# Patient Record
Sex: Male | Born: 1980 | Race: White | Hispanic: No | Marital: Married | Smoking: Never smoker
Health system: Southern US, Community
[De-identification: ages and names within clinical notes are randomized; demographics above are authoritative.]

## PROBLEM LIST (undated history)

## (undated) DIAGNOSIS — T7840XA Allergy, unspecified, initial encounter: Secondary | ICD-10-CM

## (undated) HISTORY — PX: SHOULDER ARTHROSCOPY: SHX128

## (undated) HISTORY — DX: Allergy, unspecified, initial encounter: T78.40XA

## (undated) HISTORY — PX: PILONIDAL CYST EXCISION: SHX744

---

## 2007-09-20 ENCOUNTER — Ambulatory Visit: Payer: Self-pay | Admitting: Internal Medicine

## 2013-12-20 ENCOUNTER — Ambulatory Visit: Payer: Self-pay | Admitting: Internal Medicine

## 2015-01-14 ENCOUNTER — Encounter: Payer: Self-pay | Admitting: Family Medicine

## 2015-03-25 ENCOUNTER — Ambulatory Visit (INDEPENDENT_AMBULATORY_CARE_PROVIDER_SITE_OTHER): Payer: BC Managed Care – PPO | Admitting: Family Medicine

## 2015-03-25 ENCOUNTER — Encounter: Payer: Self-pay | Admitting: Family Medicine

## 2015-03-25 VITALS — BP 100/68 | HR 78 | Temp 98.2°F | Ht 72.0 in | Wt 211.0 lb

## 2015-03-25 DIAGNOSIS — T148 Other injury of unspecified body region: Secondary | ICD-10-CM | POA: Diagnosis not present

## 2015-03-25 DIAGNOSIS — T148XXA Other injury of unspecified body region, initial encounter: Secondary | ICD-10-CM

## 2015-03-25 MED ORDER — ETODOLAC 500 MG PO TABS: 500.0000 mg | ORAL_TABLET | Freq: Two times a day (BID) | ORAL | Status: DC

## 2015-03-25 NOTE — Progress Notes (Addendum)
Name: Thomas Kirby   MRN: [id number]    DOB: May 30, 1981   Date:03/25/2015       Progress Note  Subjective  Chief Complaint  Chief Complaint  Patient presents with  . Leg Pain    R) lower leg pain- started approx 5 days ago with tightness, red, and swelling. Got worse yesterday. Pain stops at ankle, not involving the ankle    Leg Pain  The incident occurred 3 to 5 days ago (pain 3-5 days/ swelliing 1 day). Injury mechanism: "run once" The pain is present in the right leg. The pain is at a severity of 4/10. The pain is moderate. The pain has been constant since onset. Pertinent negatives include no inability to bear weight, loss of motion, loss of sensation, muscle weakness, numbness or tingling. The symptoms are aggravated by movement and palpation. He has tried ice and NSAIDs for the symptoms. The treatment provided mild relief.    No problem-specific assessment & plan notes found for this encounter.   Past Medical History  Diagnosis Date  . Allergy     Past Surgical History  Procedure Laterality Date  . Pilonidal cyst excision    . Shoulder arthroscopy Left     Family History  Problem Relation Age of Onset  . Cancer Maternal Grandfather     Social History   Social History  . Marital Status: Married    Spouse Name: N/A  . Number of Children: N/A  . Years of Education: N/A   Occupational History  . Not on file.   Social History Main Topics  . Smoking status: Never Smoker   . Smokeless tobacco: Former Neurosurgeon    Types: Snuff  . Alcohol Use: No  . Drug Use: No  . Sexual Activity: Yes   Other Topics Concern  . Not on file   Social History Narrative  . No narrative on file    Allergies  Allergen Reactions  . Mucinex [Guaifenesin Er]      Review of Systems  Constitutional: Negative for fever, chills, weight loss and malaise/fatigue.  HENT: Negative for ear discharge, ear pain and sore throat.   Eyes: Negative for blurred vision.  Respiratory: Negative  for cough, sputum production, shortness of breath and wheezing.   Cardiovascular: Negative for chest pain, palpitations and leg swelling.  Gastrointestinal: Negative for heartburn, nausea, abdominal pain, diarrhea, constipation, blood in stool and melena.  Genitourinary: Negative for dysuria, urgency, frequency and hematuria.  Musculoskeletal: Negative for myalgias, back pain, joint pain and neck pain.  Skin: Negative for rash.  Neurological: Negative for dizziness, tingling, sensory change, focal weakness, numbness and headaches.  Endo/Heme/Allergies: Negative for environmental allergies and polydipsia. Does not bruise/bleed easily.  Psychiatric/Behavioral: Negative for depression and suicidal ideas. The patient is not nervous/anxious and does not have insomnia.      Objective  Filed Vitals:   03/25/15 1012  BP: 100/68  Pulse: 78  Temp: 98.2 F (36.8 C)  TempSrc: Oral  Height: 6' (1.829 m)  Weight: 211 lb (95.709 kg)    Physical Exam  Constitutional: He is oriented to person, place, and time and well-developed, well-nourished, and in no distress.  HENT:  Head: Normocephalic.  Right Ear: External ear normal.  Left Ear: External ear normal.  Nose: Nose normal.  Mouth/Throat: Oropharynx is clear and moist.  Eyes: Conjunctivae and EOM are normal. Pupils are equal, round, and reactive to light. Right eye exhibits no discharge. Left eye exhibits no discharge. No scleral icterus.  Neck: Normal range of motion. Neck supple. No JVD present. No tracheal deviation present. No thyromegaly present.  Cardiovascular: Normal rate, regular rhythm, normal heart sounds and intact distal pulses.  Exam reveals no gallop and no friction rub.   No murmur heard. Pulmonary/Chest: Breath sounds normal. No respiratory distress. He has no wheezes. He has no rales.  Abdominal: Soft. Bowel sounds are normal. He exhibits no mass. There is no hepatosplenomegaly. There is no tenderness. There is no rebound, no  guarding and no CVA tenderness.  Musculoskeletal: He exhibits tenderness. He exhibits no edema.       Right lower leg: He exhibits tenderness.       Legs: Lymphadenopathy:    He has no cervical adenopathy.  Neurological: He is alert and oriented to person, place, and time. He has normal sensation, normal strength, normal reflexes and intact cranial nerves. No cranial nerve deficit.  Skin: Skin is warm. No rash noted.  Psychiatric: Mood and affect normal.      Assessment & Plan  Problem List Items Addressed This Visit    None    Visit Diagnoses    Contusion    -  Primary    lower leg    Relevant Medications    etodolac (LODINE) 500 MG tablet         Dr. Hayden [facility]  03/25/2015

## 2015-03-27 ENCOUNTER — Ambulatory Visit (INDEPENDENT_AMBULATORY_CARE_PROVIDER_SITE_OTHER): Payer: BC Managed Care – PPO | Admitting: Family Medicine

## 2015-03-27 ENCOUNTER — Encounter: Payer: Self-pay | Admitting: Family Medicine

## 2015-03-27 VITALS — BP 120/62 | HR 60 | Ht 72.0 in | Wt 211.0 lb

## 2015-03-27 DIAGNOSIS — L03115 Cellulitis of right lower limb: Secondary | ICD-10-CM | POA: Diagnosis not present

## 2015-03-27 DIAGNOSIS — M7989 Other specified soft tissue disorders: Secondary | ICD-10-CM

## 2015-03-27 MED ORDER — SULFAMETHOXAZOLE-TRIMETHOPRIM 800-160 MG PO TABS: 1.0000 | ORAL_TABLET | Freq: Two times a day (BID) | ORAL | Status: DC

## 2015-03-27 NOTE — Progress Notes (Signed)
Name: Thomas Kirby   MRN: [id number]    DOB: [birth year]   Date:03/27/2015       Progress Note  Subjective  Chief Complaint  Chief Complaint  Patient presents with  . Leg Swelling    started Etodolac x 2 days ago- swelling has not improved, now leg is warm to touch and tender    Leg Pain  The incident occurred 5 to 7 days ago. The injury mechanism is unknown (began running program). The pain is present in the left leg. The quality of the pain is described as aching. The pain is at a severity of 3/10. The pain is moderate. The pain has been constant since onset. Pertinent negatives include no inability to bear weight, loss of motion, loss of sensation, muscle weakness, numbness or tingling. The symptoms are aggravated by weight bearing. He has tried NSAIDs for the symptoms. The treatment provided no relief.    No problem-specific assessment & plan notes found for this encounter.   Past Medical History  Diagnosis Date  . Allergy     Past Surgical History  Procedure Laterality Date  . Pilonidal cyst excision    . Shoulder arthroscopy Left     Family History  Problem Relation Age of Onset  . Cancer Maternal Grandfather     Social History   Social History  . Marital Status: Married    Spouse Name: N/A  . Number of Children: N/A  . Years of Education: N/A   Occupational History  . Not on file.   Social History Main Topics  . Smoking status: Never Smoker   . Smokeless tobacco: Former Neurosurgeon    Types: Snuff  . Alcohol Use: No  . Drug Use: Not on file  . Sexual Activity: Yes   Other Topics Concern  . Not on file   Social History Narrative    Allergies  Allergen Reactions  . Mucinex [Guaifenesin Er]      Review of Systems  Constitutional: Negative for fever, chills, weight loss and malaise/fatigue.  HENT: Negative for ear discharge, ear pain and sore throat.   Eyes: Negative for blurred vision.  Respiratory: Negative for cough, sputum production, shortness of  breath and wheezing.   Cardiovascular: Negative for chest pain, palpitations and leg swelling.  Gastrointestinal: Negative for heartburn, nausea, abdominal pain, diarrhea, constipation, blood in stool and melena.  Genitourinary: Negative for dysuria, urgency, frequency and hematuria.  Musculoskeletal: Negative for myalgias, back pain, joint pain and neck pain.  Skin: Negative for rash.  Neurological: Negative for dizziness, tingling, sensory change, focal weakness, numbness and headaches.  Endo/Heme/Allergies: Negative for environmental allergies and polydipsia. Does not bruise/bleed easily.  Psychiatric/Behavioral: Negative for depression and suicidal ideas. The patient is not nervous/anxious and does not have insomnia.      Objective  Filed Vitals:   03/27/15 1039  BP: 120/62  Pulse: 60  Height: 6' (1.829 m)  Weight: 211 lb (95.709 kg)    Physical Exam  Constitutional: He is oriented to person, place, and time and well-developed, well-nourished, and in no distress.  HENT:  Head: Normocephalic.  Right Ear: External ear normal.  Left Ear: External ear normal.  Nose: Nose normal.  Mouth/Throat: Oropharynx is clear and moist.  Eyes: Conjunctivae and EOM are normal. Pupils are equal, round, and reactive to light. Right eye exhibits no discharge. Left eye exhibits no discharge. No scleral icterus.  Neck: Normal range of motion. Neck supple. No JVD present. No tracheal deviation present. No thyromegaly  present.  Cardiovascular: Normal rate, regular rhythm, normal heart sounds and intact distal pulses.  Exam reveals no gallop and no friction rub.   No murmur heard. Pulmonary/Chest: Breath sounds normal. No respiratory distress. He has no wheezes. He has no rales.  Abdominal: Soft. Bowel sounds are normal. He exhibits no mass. There is no hepatosplenomegaly. There is no tenderness. There is no rebound, no guarding and no CVA tenderness.  Musculoskeletal: Normal range of motion. He  exhibits edema. He exhibits no tenderness.  Swelling right leg  Lymphadenopathy:    He has no cervical adenopathy.  Neurological: He is alert and oriented to person, place, and time. He has normal sensation, normal strength, normal reflexes and intact cranial nerves. No cranial nerve deficit.  Skin: Skin is warm. No rash noted. There is erythema.  Psychiatric: Mood and affect normal.      Assessment & Plan  Problem List Items Addressed This Visit    None    Visit Diagnoses    Cellulitis of right lower extremity    -  Primary    Relevant Medications    sulfamethoxazole-trimethoprim (BACTRIM DS,SEPTRA DS) 800-160 MG tablet    Swelling of right lower extremity        Relevant Orders    US Venous Img Lower Unilateral Right         Dr. Elizabeth Sauer [facility]  03/27/2015

## 2015-03-30 ENCOUNTER — Ambulatory Visit
Admission: RE | Admit: 2015-03-30 | Discharge: 2015-03-30 | Disposition: A | Discharge status: Home / Self Care | Payer: BC Managed Care – PPO | Source: Ambulatory Visit | Attending: Family Medicine | Admitting: Family Medicine

## 2015-03-30 DIAGNOSIS — M7989 Other specified soft tissue disorders: Secondary | ICD-10-CM | POA: Insufficient documentation

## 2015-12-04 ENCOUNTER — Encounter: Payer: Self-pay | Admitting: Family Medicine

## 2015-12-04 ENCOUNTER — Ambulatory Visit (INDEPENDENT_AMBULATORY_CARE_PROVIDER_SITE_OTHER): Payer: BC Managed Care – PPO | Admitting: Family Medicine

## 2015-12-04 VITALS — BP 100/64 | HR 62 | Ht 72.0 in | Wt 218.0 lb

## 2015-12-04 DIAGNOSIS — Z131 Encounter for screening for diabetes mellitus: Secondary | ICD-10-CM | POA: Diagnosis not present

## 2015-12-04 DIAGNOSIS — Z0000 Encounter for general adult medical examination without abnormal findings: Secondary | ICD-10-CM

## 2015-12-04 DIAGNOSIS — Z309 Encounter for contraceptive management, unspecified: Secondary | ICD-10-CM | POA: Diagnosis not present

## 2015-12-04 DIAGNOSIS — Z13220 Encounter for screening for lipoid disorders: Secondary | ICD-10-CM

## 2015-12-04 DIAGNOSIS — Z3009 Encounter for other general counseling and advice on contraception: Secondary | ICD-10-CM

## 2015-12-04 LAB — POCT URINALYSIS DIPSTICK
Bilirubin, UA: NEGATIVE
Blood, UA: NEGATIVE
Glucose, UA: NEGATIVE
Ketones, UA: NEGATIVE
Leukocytes, UA: NEGATIVE
Nitrite, UA: NEGATIVE
Protein, UA: NEGATIVE
Spec Grav, UA: 1.01
Urobilinogen, UA: 0.2
pH, UA: 5

## 2015-12-04 NOTE — Progress Notes (Signed)
Name: Thomas Kirby   MRN: [id number]    DOB: Dec 07, 1980   Date:12/04/2015       Progress Note  Subjective  Chief Complaint  Chief Complaint  Patient presents with  . Annual Exam    HPI Comments: Patient presents for annual physical exam.   No problem-specific assessment & plan notes found for this encounter.   Past Medical History  Diagnosis Date  . Allergy     Past Surgical History  Procedure Laterality Date  . Pilonidal cyst excision    . Shoulder arthroscopy Left     Family History  Problem Relation Age of Onset  . Cancer Maternal Grandfather     Social History   Social History  . Marital Status: Married    Spouse Name: N/A  . Number of Children: N/A  . Years of Education: N/A   Occupational History  . Not on file.   Social History Main Topics  . Smoking status: Never Smoker   . Smokeless tobacco: Former Neurosurgeon    Types: Snuff  . Alcohol Use: No  . Drug Use: Not on file  . Sexual Activity: Yes   Other Topics Concern  . Not on file   Social History Narrative    Allergies  Allergen Reactions  . Mucinex [Guaifenesin Er]      Review of Systems  Constitutional: Negative for fever, chills, weight loss and malaise/fatigue.  HENT: Negative for ear discharge, ear pain and sore throat.   Eyes: Negative for blurred vision.  Respiratory: Negative for cough, sputum production, shortness of breath and wheezing.   Cardiovascular: Negative for chest pain, palpitations and leg swelling.  Gastrointestinal: Negative for heartburn, nausea, abdominal pain, diarrhea, constipation, blood in stool and melena.  Genitourinary: Negative for dysuria, urgency, frequency and hematuria.  Musculoskeletal: Negative for myalgias, back pain, joint pain and neck pain.  Skin: Negative for rash.  Neurological: Negative for dizziness, tingling, sensory change, focal weakness and headaches.  Endo/Heme/Allergies: Negative for environmental allergies and polydipsia. Does not  bruise/bleed easily.  Psychiatric/Behavioral: Negative for depression and suicidal ideas. The patient is not nervous/anxious and does not have insomnia.      Objective  Filed Vitals:   12/04/15 0840  BP: 100/64  Pulse: 62  Height: 6' (1.829 m)  Weight: 218 lb (98.884 kg)    Physical Exam  Constitutional: He is oriented to person, place, and time and well-developed, well-nourished, and in no distress.  HENT:  Head: Normocephalic.  Right Ear: Tympanic membrane, external ear and ear canal normal.  Left Ear: Tympanic membrane, external ear and ear canal normal.  Nose: Nose normal.  Mouth/Throat: Uvula is midline, oropharynx is clear and moist and mucous membranes are normal.  Eyes: Conjunctivae and EOM are normal. Pupils are equal, round, and reactive to light. Right eye exhibits no discharge. Left eye exhibits no discharge. No scleral icterus.  Neck: Normal range of motion. Neck supple. No JVD present. No tracheal deviation present. No thyromegaly present.  Cardiovascular: Normal rate, regular rhythm, normal heart sounds and intact distal pulses.  Exam reveals no gallop and no friction rub.   No murmur heard. Pulmonary/Chest: Breath sounds normal. No respiratory distress. He has no wheezes. He has no rales.  Abdominal: Soft. Bowel sounds are normal. He exhibits no mass. There is no hepatosplenomegaly. There is no tenderness. There is no rebound, no guarding and no CVA tenderness.  Genitourinary: Testes/scrotum normal and penis normal. He exhibits no abnormal testicular mass, no testicular tenderness and no  scrotal tenderness.  Musculoskeletal: Normal range of motion. He exhibits no edema or tenderness.  Lymphadenopathy:    He has no cervical adenopathy.  Neurological: He is alert and oriented to person, place, and time. He has normal sensation, normal strength, normal reflexes and intact cranial nerves. No cranial nerve deficit.  Skin: Skin is warm. No rash noted.  Psychiatric: Mood  and affect normal.  Nursing note and vitals reviewed.     Assessment & Plan  Problem List Items Addressed This Visit      Other   Diabetes mellitus screening   Relevant Orders   Renal Function Panel    Other Visit Diagnoses    Annual physical exam    -  Primary    Relevant Orders    POCT Urinalysis Dipstick (Completed)    Lipid Profile    Renal Function Panel    Screening cholesterol level        Relevant Orders    Lipid Profile    Renal Function Panel    Vasectomy evaluation        Relevant Orders    Ambulatory referral to Urology         Dr. Elizabeth Sauereanna Zyquan Crotty [facility]  12/04/2015

## 2015-12-05 LAB — RENAL FUNCTION PANEL
Albumin: 4.5 g/dL (ref 3.5–5.5)
BUN/Creatinine Ratio: 21 — ABNORMAL HIGH (ref 9–20)
BUN: 23 mg/dL — ABNORMAL HIGH (ref 6–20)
CO2: 24 mmol/L (ref 18–29)
Calcium: 9.7 mg/dL (ref 8.7–10.2)
Chloride: 100 mmol/L (ref 96–106)
Creatinine, Ser: 1.11 mg/dL (ref 0.76–1.27)
GFR calc Af Amer: 98 mL/min/{1.73_m2} (ref >59)
GFR calc non Af Amer: 85 mL/min/{1.73_m2} (ref >59)
Glucose: 83 mg/dL (ref 65–99)
Phosphorus: 3.1 mg/dL (ref 2.5–4.5)
Potassium: 4.5 mmol/L (ref 3.5–5.2)
Sodium: 139 mmol/L (ref 134–144)

## 2015-12-05 LAB — LIPID PANEL
Chol/HDL Ratio: 3.3 ratio units (ref 0.0–5.0)
Cholesterol, Total: 184 mg/dL (ref 100–199)
HDL: 55 mg/dL (ref >39)
LDL Calculated: 115 mg/dL — ABNORMAL HIGH (ref 0–99)
Triglycerides: 71 mg/dL (ref 0–149)
VLDL Cholesterol Cal: 14 mg/dL (ref 5–40)

## 2015-12-21 ENCOUNTER — Ambulatory Visit (INDEPENDENT_AMBULATORY_CARE_PROVIDER_SITE_OTHER): Payer: BC Managed Care – PPO | Admitting: Urology

## 2015-12-21 ENCOUNTER — Encounter: Payer: Self-pay | Admitting: Urology

## 2015-12-21 VITALS — BP 102/71 | HR 132 | Ht 71.0 in | Wt 218.5 lb

## 2015-12-21 DIAGNOSIS — Z309 Encounter for contraceptive management, unspecified: Secondary | ICD-10-CM

## 2015-12-21 DIAGNOSIS — Z3009 Encounter for other general counseling and advice on contraception: Secondary | ICD-10-CM

## 2015-12-21 MED ORDER — DIAZEPAM 10 MG PO TABS: ORAL_TABLET | ORAL | Status: DC

## 2015-12-21 NOTE — Progress Notes (Signed)
12/21/2015 3:42 PM   Thomas Kirby [birth year] [id number]  Referring provider: Duanne Limerick, MD [address] [place], [place] [address]  Chief Complaint  Patient presents with  . New Patient (Initial Visit)  . VAS Consult    HPI: [name] is a 35 year old Caucasian male who is referred today for a vasectomy by Dr. Yetta Barre.  Patient has two children, sons, and wishes to end his family unit at this point.  Patient denies any history of chronic prostatitis, epididymitis, orchitis, or other genital pain.  Today, we discussed what the vas deferens is, where it is located, and its function. We reviewed the procedure for vasectomy, it's risks, benefits, alternatives, and likelihood of achieving his goals.   We discussed in detail the procedure, complications, and recovery as well as the need for clearance prior to unprotected intercourse. We discussed that vasectomy does not protect against sexually transmitted diseases. We discussed that this procedure does not result in immediate sterility and that they would need to use other forms of birth control until he has been cleared with a three month negative postvasectomy semen analyses.  I explained that the procedure is considered to be permanent and that attempts at reversal have varying degrees of success. These options include vasectomy reversal, sperm retrieval, and in vitro fertilization; these can be very expensive.   We discussed the chance of postvasectomy pain syndrome which occurs in less than 5% of patients. I explained to the patient that there is no treatment to resolve this chronic pain, and that if it developed I would not be able to help resolve the issue, but that surgery is generally not needed for correction.   I explained there have even been reports of systemic like illness associated with this chronic pain, and that there was no good cure. I explained that vasectomy it is not a 100% reliable form of  birth control, and the risk of pregnancy after vasectomy is approximately 1 in 2000 men who had a negative postvasectomy semen analysis or rare non-motile sperm.  I explained that repeat vasectomy was necessary in less than 1% of vasectomy procedures when employing the type of technique that is performed in the office. I explained that he should refrain from ejaculation for approximately one week following vasectomy. I explained that there are other options for birth control which are permanent and non-permanent; we discussed these.  I explained the rates of surgical complications, such as symptomatic hematoma or infection, are low (1-2%) and vary with the surgeon's experience and criteria used to diagnose the complication.   PMH: Past Medical History  Diagnosis Date  . Allergy     Surgical History: Past Surgical History  Procedure Laterality Date  . Pilonidal cyst excision    . Shoulder arthroscopy Left     Home Medications:    Medication List       This list is accurate as of: 12/21/15  3:42 PM.  Always use your most recent med list.               cetirizine 10 MG tablet  Commonly known as:  ZYRTEC  Take 10 mg by mouth daily. Reported on 12/04/2015        Allergies:  Allergies  Allergen Reactions  . Mucinex [Guaifenesin Er]     Family History: Family History  Problem Relation Age of Onset  . Cancer Maternal Grandfather   . Kidney cancer Neg Hx   .  Kidney disease Neg Hx   . Prostate cancer Neg Hx     Social History:  reports that he has never smoked. He has quit using smokeless tobacco. His smokeless tobacco use included Snuff. He reports that he does not drink alcohol or use illicit drugs.  ROS: UROLOGY Frequent Urination?: No Hard to postpone urination?: No Burning/pain with urination?: No Get up at night to urinate?: No Leakage of urine?: No Urine stream starts and stops?: No Trouble starting stream?: No Do you have to strain to urinate?: No Blood in  urine?: No Urinary tract infection?: No Sexually transmitted disease?: No Injury to kidneys or bladder?: No Painful intercourse?: No Weak stream?: No Erection problems?: No Penile pain?: No  Gastrointestinal Nausea?: No Vomiting?: No Indigestion/heartburn?: No Diarrhea?: No Constipation?: No  Constitutional Fever: No Night sweats?: No Weight loss?: No Fatigue?: No  Skin Skin rash/lesions?: No Itching?: No  Eyes Blurred vision?: No Double vision?: No  Ears/Nose/Throat Sore throat?: No Sinus problems?: No  Hematologic/Lymphatic Swollen glands?: No Easy bruising?: No  Cardiovascular Leg swelling?: No Chest pain?: No  Respiratory Cough?: No Shortness of breath?: No  Endocrine Excessive thirst?: No  Musculoskeletal Back pain?: No Joint pain?: No  Neurological Headaches?: No Dizziness?: No  Psychologic Depression?: No Anxiety?: No  Physical Exam: BP 102/71 mmHg  Pulse 132  Ht 5\' 11"  (1.803 m)  Wt 218 lb 8 oz (99.111 kg)  BMI 30.49 kg/m2  Constitutional: Well nourished. Alert and oriented, No acute distress. HEENT: [place] AT, moist mucus membranes. Trachea midline, no masses. Cardiovascular: No clubbing, cyanosis, or edema. Respiratory: Normal respiratory effort, no increased work of breathing. GI: Abdomen is soft, non tender, non distended, no abdominal masses. Liver and spleen not palpable.  No hernias appreciated.  Stool sample for occult testing is not indicated.   GU: No CVA tenderness.  No bladder fullness or masses.  Patient with uncircumcised phallus. Foreskin easily retracted Urethral meatus is patent.  No penile discharge. No penile lesions or rashes. Scrotum without lesions, cysts, rashes and/or edema.  Testicles are located scrotally bilaterally. No masses are appreciated in the testicles. Left and right epididymis are normal. Rectal: Deferred.   Skin: No rashes, bruises or suspicious lesions. Lymph: No cervical or inguinal  adenopathy. Neurologic: Grossly intact, no focal deficits, moving all 4 extremities. Psychiatric: Normal mood and affect.  Laboratory Data:  Lab Results  Component Value Date   CREATININE 1.11 12/04/2015        Component Value Date/Time   CHOL 184 12/04/2015 0944   HDL 55 12/04/2015 0944   CHOLHDL 3.3 12/04/2015 0944   LDLCALC 115* 12/04/2015 0944      Assessment & Plan:     1. Vasectomy consult:  Patient has read and signed the consent.  He is given the pre-op vasectomy instruction sheet.  He is prescribed Valium 10 mg and instructed to take it 30 minutes prior to his vasectomy appointment.  He is to have a driver.  I reemphasized to the patient that this is to be considered a permanent form of birth control, that he is to use an alternative form of birth control until we receive the 3 months specimen and it is cleared of sperm and that this will not prevent STI's.  His questions are answered to his satisfaction and he understands the risks and is willing to proceed with the vasectomy.  He will schedule his vasectomy.    I spent 30 minutes in a face-to-face conversation concerning the vasectomy procedure  and pre-and post op expectations.  Greater than 50% was spent in counseling & coordination of care with the patient.   Return for schedule vasectomy.  These notes generated with voice recognition software. I apologize for typographical errors.  Michiel CowboySHANNON Vernal Rutan, PA-C  [facility] [address], [address] [place], [place] [address] [phone]

## 2016-01-27 ENCOUNTER — Ambulatory Visit (INDEPENDENT_AMBULATORY_CARE_PROVIDER_SITE_OTHER): Payer: BC Managed Care – PPO | Admitting: Urology

## 2016-01-27 ENCOUNTER — Encounter: Payer: Self-pay | Admitting: Urology

## 2016-01-27 VITALS — BP 119/76 | HR 64 | Ht 71.0 in | Wt 214.1 lb

## 2016-01-27 DIAGNOSIS — Z302 Encounter for sterilization: Secondary | ICD-10-CM | POA: Diagnosis not present

## 2016-01-27 NOTE — Progress Notes (Signed)
01/27/2016 5:38 PM   Thedora Hinders [birth year] [id number]  Referring provider: Duanne Limerick, MD [address] [place], [place] [address]  Chief Complaint  Patient presents with  . Sterilization    HPI: 35 year old male with 2 biological children who desires no further biological pregnancies. He presents today for vasectomy. His questions were all previously answered.  He has no additional questions today.   PMH: Past Medical History:  Diagnosis Date  . Allergy     Surgical History: Past Surgical History:  Procedure Laterality Date  . PILONIDAL CYST EXCISION    . SHOULDER ARTHROSCOPY Left     Home Medications:    Medication List       Accurate as of 01/27/16  5:38 PM. Always use your most recent med list.          cetirizine 10 MG tablet Commonly known as:  ZYRTEC Take 10 mg by mouth daily. Reported on 12/04/2015   diazepam 10 MG tablet Commonly known as:  VALIUM Take 30 minutes prior to vasectomy       Allergies:  Allergies  Allergen Reactions  . Mucinex [Guaifenesin Er]     Family History: Family History  Problem Relation Age of Onset  . Cancer Maternal Grandfather   . Kidney cancer Neg Hx   . Kidney disease Neg Hx   . Prostate cancer Neg Hx     Social History:  reports that he has never smoked. He has quit using smokeless tobacco. His smokeless tobacco use included Snuff. He reports that he does not drink alcohol or use drugs.     Physical Exam: BP 119/76   Pulse 64   Ht  (1.803 m)   Wt 214 lb 1.6 oz (97.1 kg)   BMI 29.86 kg/m   Constitutional:  Alert and oriented, No acute distress. HEENT: [place] AT, moist mucus membranes.  Trachea midline, no masses. Cardiovascular: No clubbing, cyanosis, or edema. Respiratory: Normal respiratory effort, no increased work of breathing. GI: Abdomen is soft, nontender, nondistended, no abdominal masses s. Circumcised phallus. Normal descended testicles bilaterally. Vasa easily  palpable. Lymph: No cervical or inguinal adenopathy. Neurologic: Grossly intact, no focal deficits, moving all 4 extremities. Psychiatric: Normal mood and affect.  Laboratory Data  Lab Results  Component Value Date   CREATININE 1.11 12/04/2015    Bilateral Vasectomy Procedure  Pre-Procedure: - Patient's scrotum was prepped and draped for vasectomy. - The vas was palpated through the scrotal skin on the left. - 1% Xylocaine was injected into the skin and surrounding tissue for placement  - In a similar manner, the vas on the right was identified, anesthetized, and stabilized.  Procedure: - A bladeless technique was used to open the overlying skin (sharp dissection) - The left vas was isolated and brought up through the incision exposing that structure. - Bleeding points were cauterized as they occurred. - The vas was free from the surrounding structures and brought to the view. - A segment was positioned for placement with a hemostat. - A second hemostat was placed and a small segment between the two hemostats and was removed for inspection. - Each end of the transected vas lumen was fulgurated/ obliterated using needlepoint electrocautery -A fascial interposition was performed on testicular end of the vas using #3-0 chromic suture -The same procedure was performed on the right. - A single suture of #3-0 chromic catgut was used to close each lateral scrotal skin incision - A dressing was applied.  Post-Procedure: -  Patient was instructed in care of the operative area - A specimen is to be delivered in 12 weeks   -Another form of contraception is to be used until    Assessment & Plan:    1. Encounter for vasectomy Status post uncomplicated vasectomy Postop instructions as above  Vanna ScotlandAshley Needham Biggins, MD  [facility] [address], [address] [place], [place] [address] [phone]

## 2016-06-16 ENCOUNTER — Other Ambulatory Visit: Payer: BC Managed Care – PPO

## 2016-06-16 DIAGNOSIS — Z9852 Vasectomy status: Secondary | ICD-10-CM

## 2016-06-17 LAB — POST-VAS SPERM EVALUATION,QUAL: Volume: 1.8 mL

## 2016-06-21 ENCOUNTER — Telehealth: Payer: Self-pay

## 2016-06-21 NOTE — Telephone Encounter (Signed)
LMOM- recent analysis is clear, good to go.

## 2016-06-21 NOTE — Telephone Encounter (Signed)
-----   Message from Vanna ScotlandAshley Brandon, MD sent at 06/19/2016 11:59 AM EST ----- No sperm, good to go.    Vanna ScotlandAshley Brandon, MD

## 2016-07-08 ENCOUNTER — Encounter: Payer: Self-pay | Admitting: Family Medicine

## 2016-07-08 ENCOUNTER — Ambulatory Visit (INDEPENDENT_AMBULATORY_CARE_PROVIDER_SITE_OTHER): Payer: BC Managed Care – PPO | Admitting: Family Medicine

## 2016-07-08 VITALS — BP 100/62 | HR 64 | Ht 71.0 in | Wt 206.0 lb

## 2016-07-08 DIAGNOSIS — J40 Bronchitis, not specified as acute or chronic: Secondary | ICD-10-CM

## 2016-07-08 DIAGNOSIS — J0100 Acute maxillary sinusitis, unspecified: Secondary | ICD-10-CM | POA: Diagnosis not present

## 2016-07-08 MED ORDER — AMOXICILLIN 500 MG PO CAPS: 500.0000 mg | ORAL_CAPSULE | Freq: Three times a day (TID) | ORAL | 0 refills | Status: DC

## 2016-07-08 NOTE — Progress Notes (Signed)
Patient: Thomas Kirby, Male    DOB: 12-07-1980, 36 y.o.   MRN: [id number] Visit Date: 07/08/2016  Today's Provider: Elizabeth Sauer, MD   Chief Complaint  Patient presents with  . Sinusitis    cough and cong- facial pressure and headache, teeth hurting   Subjective:   Initial preventative physical exam Thomas Kirby is a 36 y.o. male who presents today for his Initial Preventative Physical Exam. He feels well. He reports exercising 15 minutes. He reports he is sleeping well.  Sinusitis  This is a new problem. The current episode started in the past 7 days. The problem has been gradually worsening since onset. There has been no fever. Associated symptoms include congestion, coughing, headaches, a hoarse voice, shortness of breath, sinus pressure, sneezing and a sore throat. Pertinent negatives include no chills, diaphoresis, ear pain, neck pain or swollen glands. Past treatments include nothing. The treatment provided no relief.  Cough  This is a new problem. The current episode started in the past 7 days. The problem has been gradually worsening. The cough is productive of purulent sputum. Associated symptoms include a fever, headaches, hemoptysis, nasal congestion, a sore throat and shortness of breath. Pertinent negatives include no chest pain, chills, ear pain, myalgias, postnasal drip, rash or wheezing. He has tried nothing for the symptoms. There is no history of environmental allergies.    Review of Systems  Constitutional: Positive for fever. Negative for chills and diaphoresis.  HENT: Positive for congestion, hoarse voice, sinus pressure, sneezing and sore throat. Negative for drooling, ear discharge, ear pain and postnasal drip.   Respiratory: Positive for cough, hemoptysis and shortness of breath. Negative for wheezing.   Cardiovascular: Negative for chest pain, palpitations and leg swelling.  Gastrointestinal: Negative for abdominal pain, blood in stool, constipation, diarrhea and  nausea.  Endocrine: Negative for polydipsia.  Genitourinary: Negative for dysuria, frequency, hematuria and urgency.  Musculoskeletal: Negative for back pain, myalgias and neck pain.  Skin: Negative for rash.  Allergic/Immunologic: Negative for environmental allergies.  Neurological: Positive for headaches. Negative for dizziness.  Hematological: Does not bruise/bleed easily.  Psychiatric/Behavioral: Negative for suicidal ideas. The patient is not nervous/anxious.     Social History   Social History  . Marital status: Married    Spouse name: N/A  . Number of children: N/A  . Years of education: N/A   Occupational History  . Not on file.   Social History Main Topics  . Smoking status: Never Smoker  . Smokeless tobacco: Former Neurosurgeon    Types: Snuff  . Alcohol use No  . Drug use: No  . Sexual activity: Yes   Other Topics Concern  . Not on file   Social History Narrative  . No narrative on file    Patient Active Problem List   Diagnosis Date Noted  . Diabetes mellitus screening 12/04/2015    Past Surgical History:  Procedure Laterality Date  . PILONIDAL CYST EXCISION    . SHOULDER ARTHROSCOPY Left     His family history includes Cancer in his maternal grandfather.     Previous Medications   CETIRIZINE (ZYRTEC) 10 MG TABLET    Take 10 mg by mouth daily. Reported on 12/04/2015    Patient Care Team: Duanne Limerick, MD as PCP - General (Family Medicine)      Objective:   Vitals: BP 100/62   Pulse 64   Ht 5\' 11"  (1.803 m)   Wt 206 lb (93.4 kg)   BMI 28.73  kg/m   Physical Exam  Constitutional: He is oriented to person, place, and time. He appears well-developed and well-nourished.  HENT:  Head: Normocephalic.  Right Ear: External ear normal.  Left Ear: External ear normal.  Nose: Nose normal.  Mouth/Throat: Oropharynx is clear and moist.  Eyes: Conjunctivae and EOM are normal. Pupils are equal, round, and reactive to light.  Neck: Normal range of  motion. Neck supple.  Cardiovascular: Normal rate, regular rhythm, normal heart sounds and intact distal pulses.   Pulmonary/Chest: Effort normal and breath sounds normal. He has no wheezes. He has no rales.  Abdominal: Soft. Bowel sounds are normal. There is no tenderness. There is no rebound.  Genitourinary: Rectum normal, prostate normal and penis normal.  Musculoskeletal: Normal range of motion.  Neurological: He is alert and oriented to person, place, and time.  Skin: Skin is warm and dry.  Psychiatric: He has a normal mood and affect. His behavior is normal. Judgment and thought content normal.  Nursing note and vitals reviewed.    No exam data present  Activities of Daily Living In your present state of health, do you have any difficulty performing the following activities: 12/04/2015  Hearing? N  Vision? N  Difficulty concentrating or making decisions? N  Walking or climbing stairs? N  Dressing or bathing? N  Doing errands, shopping? N  Some recent data might be hidden    Fall Risk Assessment Fall Risk  12/04/2015  Falls in the past year? No     Patient reports there  safety devices in place in shower at home.   Depression Screen PHQ 2/9 Scores 12/04/2015  PHQ - 2 Score 0    Cognitive Testing - 6-CIT   Correct? Score   What year is it? yes 0 Yes = 0    No = 4  What month is it? yes 0 Yes = 0    No = 3  Remember:     Floyde ParkinsJohn Smith, [address] [place], [place]     What time is it? yes 0 Yes = 0    No = 3  Count backwards from 20 to 1 yes 0 Correct = 0    1 error = 2   More than 1 error = 4  Say the months of the year in reverse. yes 0 Correct = 0    1 error = 2   More than 1 error = 4  What address did I ask you to remember? yes 0 Correct = 0  1 error = 2    2 error = 4    3 error = 6    4 error = 8    All wrong = 10       TOTAL SCORE  0/28   Interpretation:  Normal  Normal (0-7) Abnormal (8-28)     Assessment & Plan:     Initial Preventative Physical  Exam  Reviewed patient's Family Medical History Reviewed and updated list of patient's medical providers Assessment of cognitive impairment was done Assessed patient's functional ability Established a written schedule for health screening services Health Risk Assessent Completed and Reviewed  Exercise Activities and Dietary recommendations Goals    None       There is no immunization history on file for this patient.  Health Maintenance  Topic Date Due  . HIV Screening  07/04/1994  . TETANUS/TDAP  07/04/1998  . INFLUENZA VACCINE  01/19/2016     Discussed health benefits of physical activity,  and encouraged him to engage in regular exercise appropriate for his age and condition.    ------------------------------------------------------------------------------------------------------------  Problem List Items Addressed This Visit    None    Visit Diagnoses    Acute maxillary sinusitis, recurrence not specified    -  Primary   Relevant Medications   amoxicillin (AMOXIL) 500 MG capsule   Bronchitis       Relevant Medications   amoxicillin (AMOXIL) 500 MG capsule       Elizabeth Sauer, MD [facility]  07/08/2016

## 2016-08-07 ENCOUNTER — Ambulatory Visit
Admission: EM | Admit: 2016-08-07 | Discharge: 2016-08-07 | Disposition: A | Discharge status: Home / Self Care | Payer: BC Managed Care – PPO | Attending: Family Medicine | Admitting: Family Medicine

## 2016-08-07 DIAGNOSIS — R69 Illness, unspecified: Secondary | ICD-10-CM

## 2016-08-07 DIAGNOSIS — J111 Influenza due to unidentified influenza virus with other respiratory manifestations: Secondary | ICD-10-CM

## 2016-08-07 MED ORDER — OSELTAMIVIR PHOSPHATE 75 MG PO CAPS: 75.0000 mg | ORAL_CAPSULE | Freq: Two times a day (BID) | ORAL | 0 refills | Status: DC

## 2016-08-07 NOTE — Discharge Instructions (Signed)
Take medication as prescribed. Rest. Drink plenty of fluids.  ° °Follow up with your primary care physician this week as needed. Return to Urgent care for new or worsening concerns.  ° °

## 2016-08-07 NOTE — ED Provider Notes (Signed)
[facility] ____________________________________________  Time seen: Approximately 9:54 AM  I have reviewed the triage vital signs and the nursing notes.   HISTORY  Chief Complaint Facial Pain (as per pt onset yesterday had sinusitis  and by night had fever 101 and taken OTC meds)   HPI Thomas Kirby is a 36 y.o. male presenting for the complaints of onset of cough, congestion and chills, body aches and fever last night. Patient reports yesterday he felt fine. Patient denies sore throat. Reports continues to eat and drink well. Intermittently taking Tylenol intermittently for his fever. Reports fever up to 101 orally. Denies sick contacts. 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   Denies chest pain, shortness of breath, abdominal pain, dysuria, extremity pain, extremity swelling or rash. Denies recent sickness. Denies recent antibiotic use. Denies renal insufficiency.   Past Medical History:  Diagnosis Date  . Allergy     Patient Active Problem List   Diagnosis Date Noted  . Diabetes mellitus screening 12/04/2015    Past Surgical History:  Procedure Laterality Date  . PILONIDAL CYST EXCISION    . SHOULDER ARTHROSCOPY Left      No current facility-administered medications for this encounter.   Current Outpatient Prescriptions:  .  amoxicillin (AMOXIL) 500 MG capsule, Take 1 capsule (500 mg total) by mouth 3 (three) times daily., Disp: 30 capsule, Rfl: 0 .  cetirizine (ZYRTEC) 10 MG tablet, Take 10 mg by mouth daily. Reported on 12/04/2015, Disp: , Rfl:  .  oseltamivir (TAMIFLU) 75 MG capsule, Take 1 capsule (75 mg total) by mouth every 12 (twelve) hours., Disp: 10 capsule, Rfl: 0  Allergies Mucinex [guaifenesin er]  Family History  Problem Relation Age of Onset  . Cancer Maternal Grandfather   . Kidney cancer Neg Hx   . Kidney disease Neg Hx   . Prostate cancer Neg Hx     Social History Social  History  Substance Use Topics  . Smoking status: Never Smoker  . Smokeless tobacco: Former Neurosurgeon    Types: Snuff  . Alcohol use No    Review of Systems Constitutional: As above.  Eyes: No visual changes. ENT: No sore throat. Cardiovascular: Denies chest pain. Respiratory: Denies shortness of breath. Gastrointestinal: No abdominal pain.  No nausea, no vomiting.  No diarrhea.  No constipation. Genitourinary: Negative for dysuria. Musculoskeletal: Negative for back pain. Skin: Negative for rash. Neurological: Negative for headaches, focal weakness or numbness.  10-point ROS otherwise negative.  ____________________________________________   PHYSICAL EXAM:  VITAL SIGNS: ED Triage Vitals [08/07/16 0821]  Enc Vitals Group     BP 116/64     Pulse Rate 77     Resp 16     Temp 98.1 F (36.7 C)     Temp Source Oral     SpO2 96 %     Weight      Height      Head Circumference      Peak Flow      Pain Score 0     Pain Loc      Pain Edu?      Excl. in GC?     Constitutional: Alert and oriented. Well appearing and in no acute distress. Eyes: Conjunctivae are normal. PERRL. EOMI. Head: Atraumatic. No sinus tenderness to palpation. No swelling. No erythema.  Ears: no erythema, normal TMs bilaterally.   Nose:Nasal congestion with clear rhinorrhea  Mouth/Throat: Mucous membranes are moist.  No pharyngeal erythema. No tonsillar swelling or exudate.  Neck: No stridor.  No cervical spine tenderness to palpation. Hematological/Lymphatic/Immunilogical: No cervical lymphadenopathy. Cardiovascular: Normal rate, regular rhythm. Grossly normal heart sounds.  Good peripheral circulation. Respiratory: Normal respiratory effort.  No retractions. No wheezes, rales or rhonchi. Good air movement.  Gastrointestinal: Soft and nontender. Normal Bowel sounds. No CVA tenderness. Musculoskeletal: Ambulatory with steady gait. No cervical, thoracic or lumbar tenderness to palpation. Neurologic:   Normal speech and language. No gait instability. Skin:  Skin appears warm, dry and intact. No rash noted. Psychiatric: Mood and affect are normal. Speech and behavior are normal.  ___________________________________________   LABS (all labs ordered are listed, but only abnormal results are displayed)  Labs Reviewed - No data to display ____________________________________________   PROCEDURES Procedures    INITIAL IMPRESSION / ASSESSMENT AND PLAN / ED COURSE  Pertinent labs & imaging results that were available during my care of the patient were reviewed by me and considered in my medical decision making (see chart for details).  Mother and patient. No acute distress. Suspect influenza. Discussed evaluation treatment options with patient. Will culture patient with oral Tamiflu. Encouraged supportive care, rest, fluids and PCP follow-up as needed.Discussed indication, risks and benefits of medications with patient.  Discussed follow up with Primary care physician this week. Discussed follow up and return parameters including no resolution or any worsening concerns. Patient verbalized understanding and agreed to plan.   ____________________________________________   FINAL CLINICAL IMPRESSION(S) / ED DIAGNOSES  Final diagnoses:  Influenza-like illness     Discharge Medication List as of 08/07/2016  8:48 AM    START taking these medications   Details  oseltamivir (TAMIFLU) 75 MG capsule Take 1 capsule (75 mg total) by mouth every 12 (twelve) hours., Starting Sun 08/07/2016, Normal        Note: This dictation was prepared with Dragon dictation along with smaller phrase technology. Any transcriptional errors that result from this process are unintentional.        Renford DillsLindsey Jlee Harkless, NP 08/07/16 [phone]

## 2017-04-13 IMAGING — US US EXTREM LOW VENOUS*R*
1 series · 13 of 24 positions shown · non-contrast
Comparison: None.

CLINICAL DATA: Right lower extremity pain and swelling for 1 week.
Evaluate for DVT.



[Series 1: us extrem low venous*right* · 0.08mm/px · 13 of 39 slices shown]
[im 1/39]
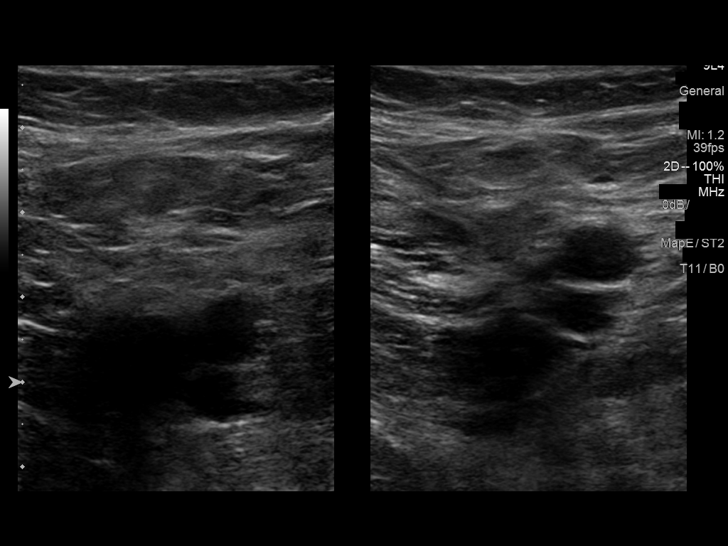
[im 4/39]
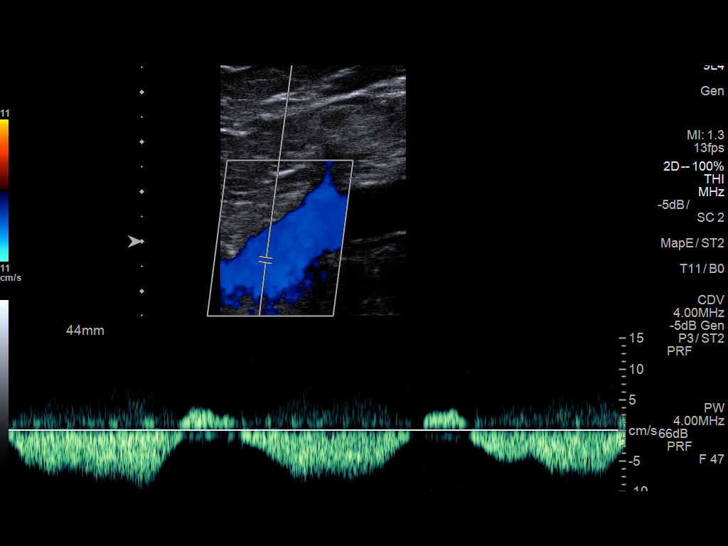
[im 7/39]
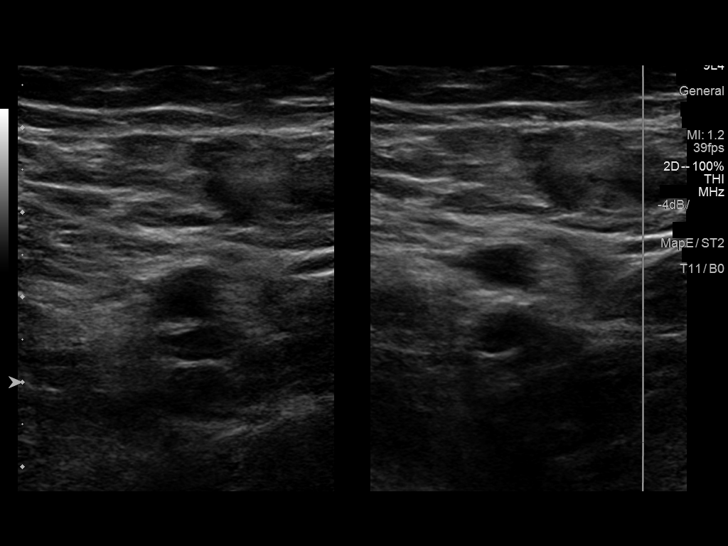
[im 10/39]
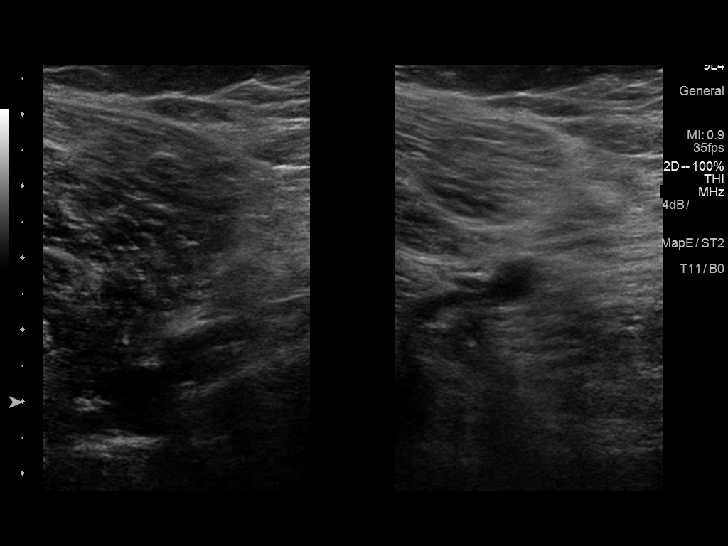
[im 14/39]
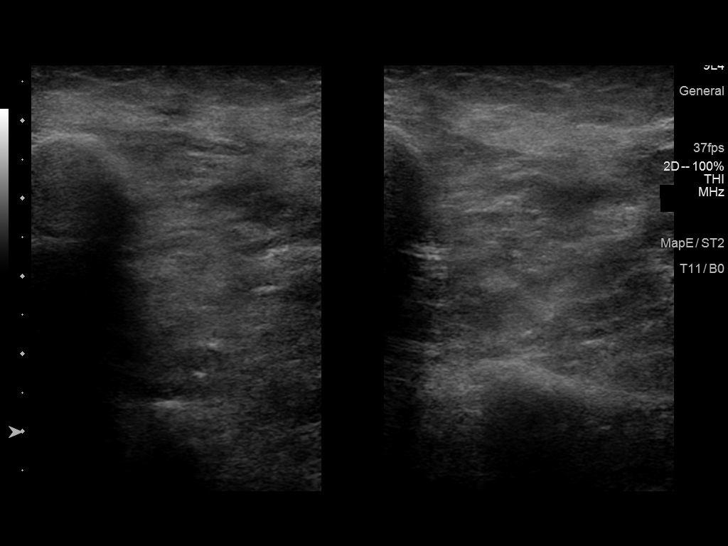
[im 17/39]
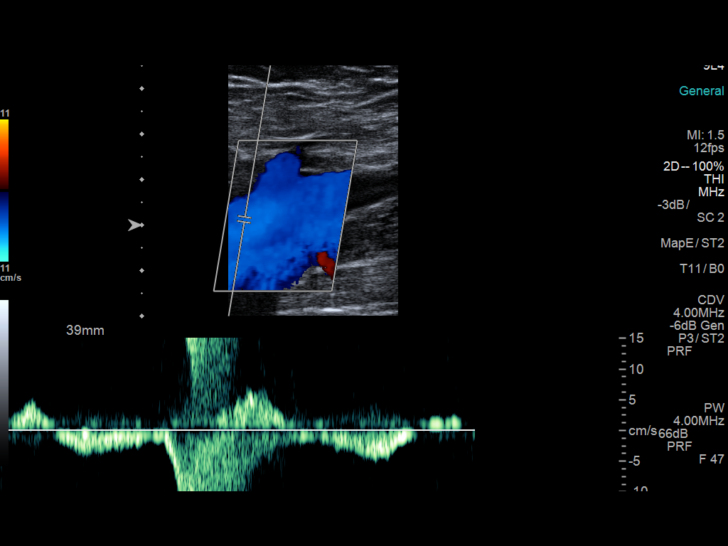
[im 20/39]
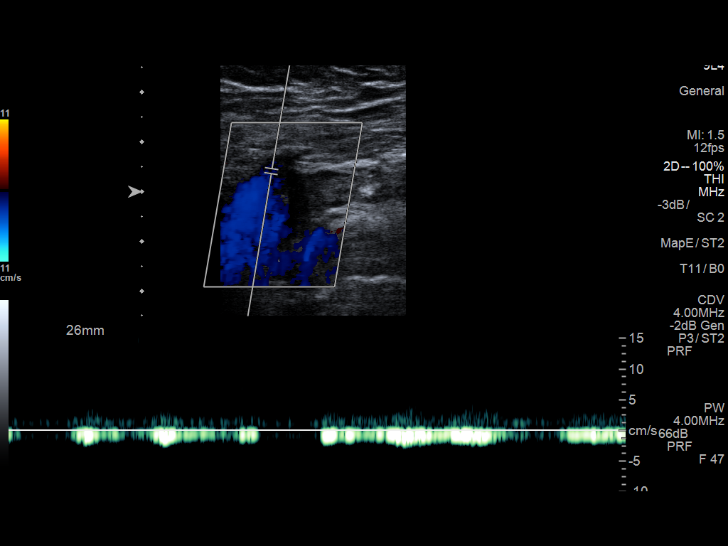
[im 22/39]
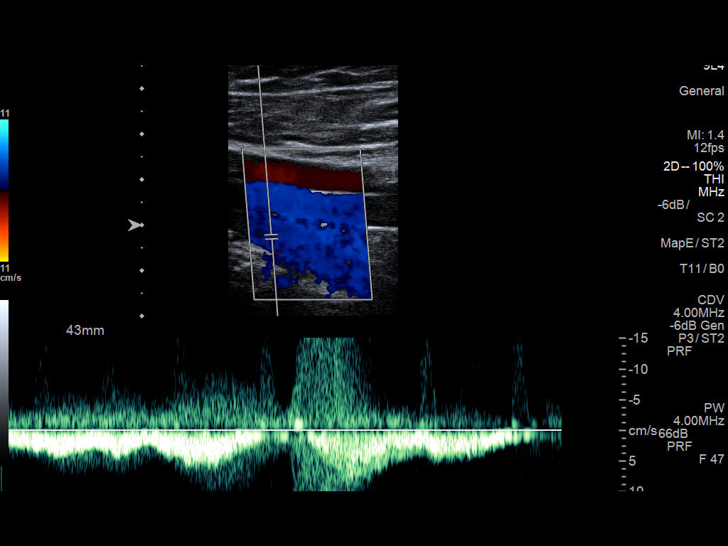
[im 25/39]
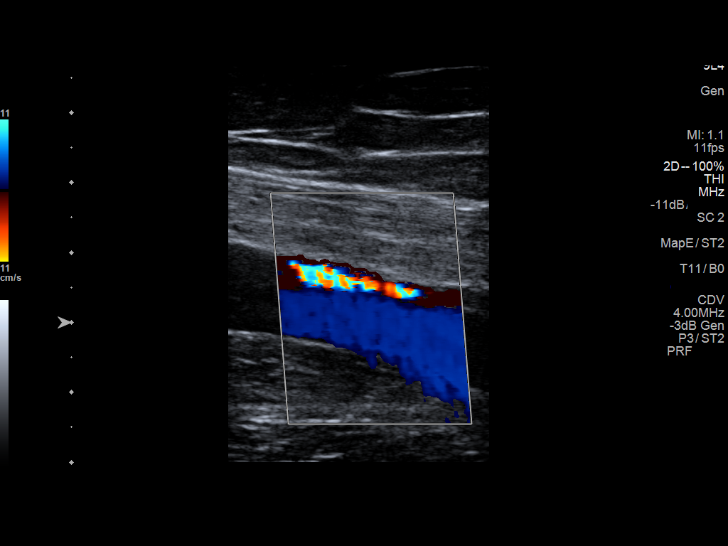
[im 29/39]
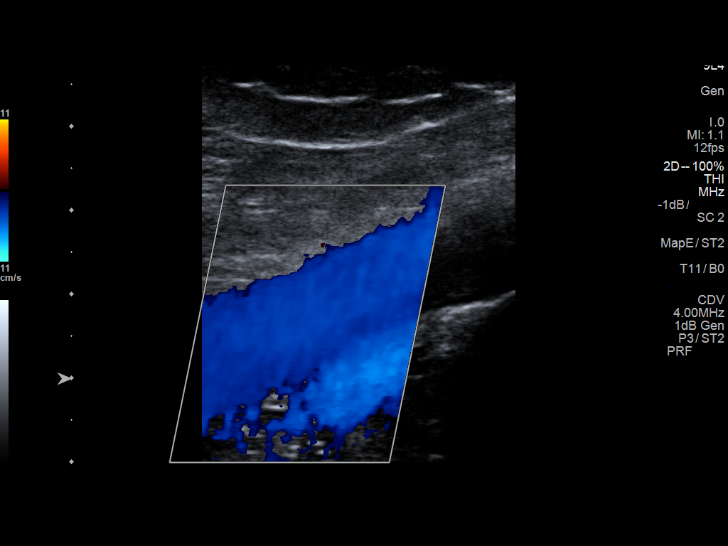
[im 32/39]
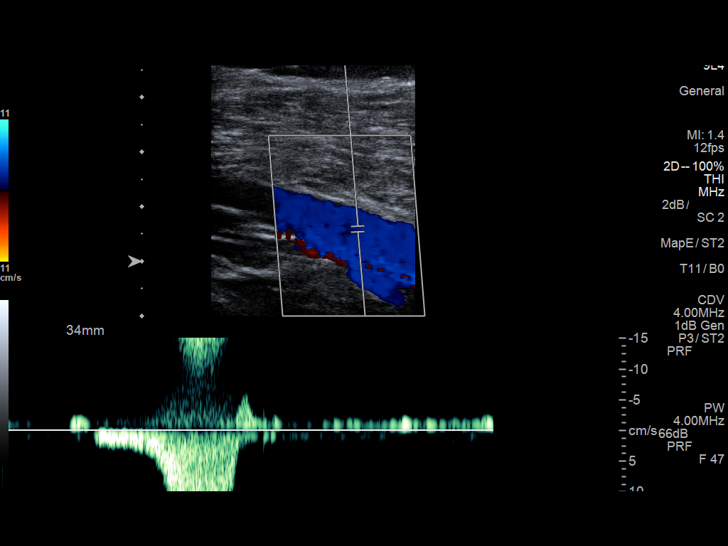
[im 35/39]
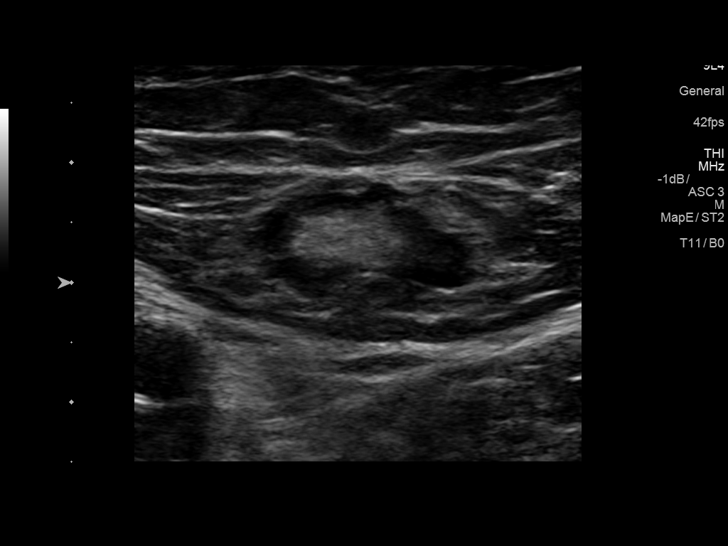
[im 39/39]
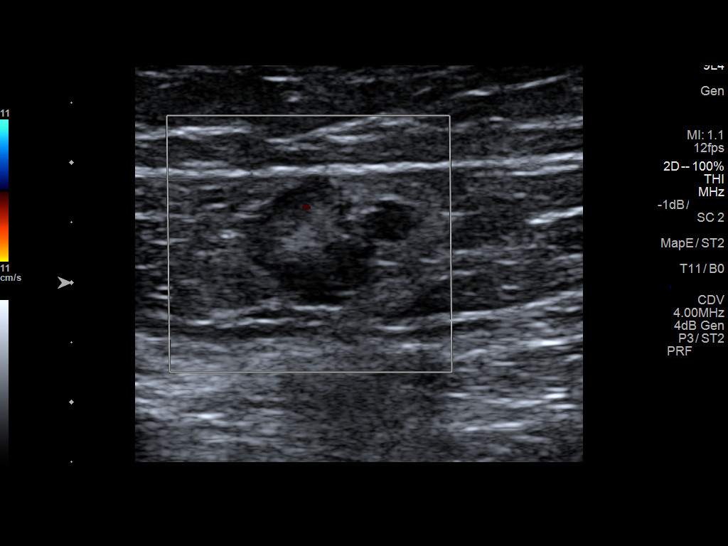

[13 of 24 positions shown; findings below may reference images not displayed]

FINDINGS: Contralateral Common Femoral Vein: Respiratory phasicity is normal
and symmetric with the symptomatic side. No evidence of thrombus.
Normal compressibility.

Common Femoral Vein: No evidence of thrombus. Normal
compressibility, respiratory phasicity and response to augmentation.

Saphenofemoral Junction: No evidence of thrombus. Normal
compressibility and flow on color Doppler imaging.

Profunda Femoral Vein: No evidence of thrombus. Normal
compressibility and flow on color Doppler imaging.

Femoral Vein: No evidence of thrombus. Normal compressibility,
respiratory phasicity and response to augmentation.

Popliteal Vein: No evidence of thrombus. Normal compressibility,
respiratory phasicity and response to augmentation.

Calf Veins: No evidence of thrombus. Normal compressibility and flow
on color Doppler imaging.

Superficial Great Saphenous Vein: No evidence of thrombus. Normal
compressibility and flow on color Doppler imaging.

Venous Reflux:  None.

Other Findings: Benign appearing right inguinal lymph node
incidentally noted which is not enlarged by size criteria, measuring
0.7 cm in greatest short axis diameter and maintaining a benign
fatty hila.
IMPRESSION: No evidence of DVT within the right lower extremity.

## 2017-08-19 ENCOUNTER — Ambulatory Visit
Admission: EM | Admit: 2017-08-19 | Discharge: 2017-08-19 | Disposition: A | Discharge status: Home / Self Care | Payer: BC Managed Care – PPO | Attending: Family Medicine | Admitting: Family Medicine

## 2017-08-19 ENCOUNTER — Other Ambulatory Visit: Payer: Self-pay

## 2017-08-19 ENCOUNTER — Encounter: Payer: Self-pay | Admitting: *Deleted

## 2017-08-19 DIAGNOSIS — J0100 Acute maxillary sinusitis, unspecified: Secondary | ICD-10-CM | POA: Diagnosis not present

## 2017-08-19 MED ORDER — AMOXICILLIN-POT CLAVULANATE 875-125 MG PO TABS: 1.0000 | ORAL_TABLET | Freq: Two times a day (BID) | ORAL | 0 refills | Status: DC

## 2017-08-19 NOTE — ED Triage Notes (Signed)
Patient started having sinus pressure and drainage 3 weeks ago. OTC medications have not resolved symptoms. Previous history of sinus infection.

## 2017-08-19 NOTE — Discharge Instructions (Signed)
Take medication as prescribed. Rest. Drink plenty of fluids.  ° °Follow up with your primary care physician this week as needed. Return to Urgent care for new or worsening concerns.  ° °

## 2017-08-19 NOTE — ED Provider Notes (Signed)
[facility] ____________________________________________  Time seen: Approximately 9:20 AM  I have reviewed the triage vital signs and the nursing notes.   HISTORY  Chief Complaint Nasal Congestion   HPI Thomas Kirby is a 37 y.o. male presenting for evaluation of 3-4 weeks of runny nose, nasal congestion, sinus pressure and sinus drainage.  Patient reports he felt like symptoms started out with a cold.  States he started to feel better but then worsened and symptoms have not improved since.  States only occasional cough.  Denies accompanying sore throat, fevers, breathing changes.  States symptoms have been unresolved with multiple over-the-counter cough and decongestant agents.  States having a lot of sinus pressure particularly around his cheekbones.  States gets a lot of thick drainage out but takes a lot of force to get the drainage out as it feels clogged.  Denies other aggravating or alleviating factors.  Reports his kids recently been sick.  Reports otherwise feels well. Denies chest pain, shortness of breath, abdominal pain, or rash. Denies recent sickness. Denies recent antibiotic use.    Past Medical History:  Diagnosis Date  . Allergy     Patient Active Problem List   Diagnosis Date Noted  . Diabetes mellitus screening 12/04/2015    Past Surgical History:  Procedure Laterality Date  . PILONIDAL CYST EXCISION    . SHOULDER ARTHROSCOPY Left      No current facility-administered medications for this encounter.   Current Outpatient Medications:  .  amoxicillin-clavulanate (AUGMENTIN) 875-125 MG tablet, Take 1 tablet by mouth every 12 (twelve) hours., Disp: 20 tablet, Rfl: 0  Allergies Mucinex [guaifenesin er]  Family History  Problem Relation Age of Onset  . Cancer Maternal Grandfather   . Diverticulitis Father   . Kidney cancer Neg Hx   . Kidney disease Neg Hx   . Prostate cancer Neg Hx     Social History Social History   Tobacco Use  .  Smoking status: Never Smoker  . Smokeless tobacco: Former NeurosurgeonUser    Types: Snuff  Substance Use Topics  . Alcohol use: No    Alcohol/week: 0.0 oz  . Drug use: No    Review of Systems Constitutional: No fever/chills ENT: No sore throat. As above.  Cardiovascular: Denies chest pain. Respiratory: Denies shortness of breath. Gastrointestinal: No abdominal pain.   Musculoskeletal: Negative for back pain. Skin: Negative for rash.  ____________________________________________   PHYSICAL EXAM:  VITAL SIGNS: ED Triage Vitals  Enc Vitals Group     BP 08/19/17 0907 106/67     Pulse Rate 08/19/17 0907 68     Resp 08/19/17 0907 16     Temp 08/19/17 0907 97.9 F (36.6 C)     Temp Source 08/19/17 0907 Oral     SpO2 08/19/17 0907 96 %     Weight 08/19/17 0909 200 lb (90.7 kg)     Height 08/19/17 0909 5\' 10"  (1.778 m)     Head Circumference --      Peak Flow --      Pain Score 08/19/17 0909 0     Pain Loc --      Pain Edu? --      Excl. in GC? --     Constitutional: Alert and oriented. Well appearing and in no acute distress. Eyes: Conjunctivae are normal.  Head: Atraumatic.Mild to moderate tenderness to palpation bilateral frontal and maxillary sinuses. No swelling. No erythema.   Ears: no erythema, normal TMs bilaterally.   Nose: nasal congestion  with bilateral nasal turbinate erythema and edema.   Mouth/Throat: Mucous membranes are moist.  Oropharynx non-erythematous.No tonsillar swelling or exudate.  Neck: No stridor.  No cervical spine tenderness to palpation. Hematological/Lymphatic/Immunilogical: No cervical lymphadenopathy. Cardiovascular: Normal rate, regular rhythm. Grossly normal heart sounds.  Good peripheral circulation. Respiratory: Normal respiratory effort.  No retractions. No wheezes, rales or rhonchi. Good air movement.  Musculoskeletal: Steady gait. Neurologic:  Normal speech and language. No gait instability. Skin:  Skin is warm, dry and intact. No rash  noted. Psychiatric: Mood and affect are normal. Speech and behavior are normal.  ___________________________________________   LABS (all labs ordered are listed, but only abnormal results are displayed)  Labs Reviewed - No data to display ____________________________________________  PROCEDURES Procedures    INITIAL IMPRESSION / ASSESSMENT AND PLAN / ED COURSE  Pertinent labs & imaging results that were available during my care of the patient were reviewed by me and considered in my medical decision making (see chart for details).  Well-appearing patient.  No acute distress.  Suspect sinusitis.  Will treat with oral Augmentin.  Encourage rest, fluids, supportive care.  Discussed follow up with Primary care physician this week. Discussed follow up and return parameters including no resolution or any worsening concerns. Patient verbalized understanding and agreed to plan.   ____________________________________________   FINAL CLINICAL IMPRESSION(S) / ED DIAGNOSES  Final diagnoses:  Acute non-recurrent maxillary sinusitis     ED Discharge Orders        Ordered    amoxicillin-clavulanate (AUGMENTIN) 875-125 MG tablet  Every 12 hours     08/19/17 0928       Note: This dictation was prepared with Dragon dictation along with smaller phrase technology. Any transcriptional errors that result from this process are unintentional.         Renford Dills, NP 08/19/17 [phone]

## 2017-10-11 ENCOUNTER — Encounter: Payer: Self-pay | Admitting: Family Medicine

## 2017-10-11 ENCOUNTER — Ambulatory Visit: Payer: BC Managed Care – PPO | Admitting: Family Medicine

## 2017-10-11 VITALS — BP 120/64 | HR 60 | Ht 70.0 in | Wt 206.0 lb

## 2017-10-11 DIAGNOSIS — J0100 Acute maxillary sinusitis, unspecified: Secondary | ICD-10-CM

## 2017-10-11 DIAGNOSIS — J40 Bronchitis, not specified as acute or chronic: Secondary | ICD-10-CM | POA: Diagnosis not present

## 2017-10-11 MED ORDER — AMOXICILLIN 500 MG PO CAPS: 500.0000 mg | ORAL_CAPSULE | Freq: Three times a day (TID) | ORAL | 0 refills | Status: DC

## 2017-10-11 MED ORDER — BENZONATATE 100 MG PO CAPS: 100.0000 mg | ORAL_CAPSULE | Freq: Two times a day (BID) | ORAL | 0 refills | Status: DC | PRN

## 2017-10-11 NOTE — Progress Notes (Signed)
Name: OMRI BERTRAN   MRN: [id number]    DOB: 16-Jun-1981   Date:10/11/2017       Progress Note  Subjective  Chief Complaint  Chief Complaint  Patient presents with  . Sinusitis    cong and cough- feeling bad for 6 days. Noticed that he gets tired quicker. Cough started yesterday- early in the am production    Sinusitis  This is a new problem. The current episode started in the past 7 days. The problem has been gradually improving since onset. There has been no fever. The pain is moderate. Associated symptoms include congestion, coughing, sinus pressure and sneezing. Pertinent negatives include no chills, diaphoresis, ear pain, headaches, hoarse voice, neck pain, shortness of breath, sore throat or swollen glands. (Purulent sinonasal drainage/yellow/green) Past treatments include acetaminophen. The treatment provided mild relief.    No problem-specific Assessment & Plan notes found for this encounter.   Past Medical History:  Diagnosis Date  . Allergy     Past Surgical History:  Procedure Laterality Date  . PILONIDAL CYST EXCISION    . SHOULDER ARTHROSCOPY Left     Family History  Problem Relation Age of Onset  . Cancer Maternal Grandfather   . Diverticulitis Father   . Kidney cancer Neg Hx   . Kidney disease Neg Hx   . Prostate cancer Neg Hx     Social History   Socioeconomic History  . Marital status: Married    Spouse name: Not on file  . Number of children: Not on file  . Years of education: Not on file  . Highest education level: Not on file  Occupational History  . Not on file  Social Needs  . Financial resource strain: Not on file  . Food insecurity:    Worry: Not on file    Inability: Not on file  . Transportation needs:    Medical: Not on file    Non-medical: Not on file  Tobacco Use  . Smoking status: Never Smoker  . Smokeless tobacco: Former Neurosurgeon    Types: Snuff  Substance and Sexual Activity  . Alcohol use: No    Alcohol/week: 0.0 oz  . Drug  use: No  . Sexual activity: Yes  Lifestyle  . Physical activity:    Days per week: Not on file    Minutes per session: Not on file  . Stress: Not on file  Relationships  . Social connections:    Talks on phone: Not on file    Gets together: Not on file    Attends religious service: Not on file    Active member of club or organization: Not on file    Attends meetings of clubs or organizations: Not on file    Relationship status: Not on file  . Intimate partner violence:    Fear of current or ex partner: Not on file    Emotionally abused: Not on file    Physically abused: Not on file    Forced sexual activity: Not on file  Other Topics Concern  . Not on file  Social History Narrative  . Not on file    Allergies  Allergen Reactions  . Mucinex [Guaifenesin Er]     Outpatient Medications Prior to Visit  Medication Sig Dispense Refill  . amoxicillin-clavulanate (AUGMENTIN) 875-125 MG tablet Take 1 tablet by mouth every 12 (twelve) hours. 20 tablet 0   No facility-administered medications prior to visit.     Review of Systems  Constitutional: Negative for chills,  diaphoresis, fever, malaise/fatigue and weight loss.  HENT: Positive for congestion, sinus pressure and sneezing. Negative for ear discharge, ear pain, hoarse voice and sore throat.   Eyes: Negative for blurred vision.  Respiratory: Positive for cough. Negative for sputum production, shortness of breath and wheezing.   Cardiovascular: Negative for chest pain, palpitations and leg swelling.  Gastrointestinal: Negative for abdominal pain, blood in stool, constipation, diarrhea, heartburn, melena and nausea.  Genitourinary: Negative for dysuria, frequency, hematuria and urgency.  Musculoskeletal: Negative for back pain, joint pain, myalgias and neck pain.  Skin: Negative for rash.  Neurological: Negative for dizziness, tingling, sensory change, focal weakness and headaches.  Endo/Heme/Allergies: Negative for  environmental allergies and polydipsia. Does not bruise/bleed easily.  Psychiatric/Behavioral: Negative for depression and suicidal ideas. The patient is not nervous/anxious and does not have insomnia.      Objective  Vitals:   10/11/17 1033  BP: 120/64  Pulse: 60  Weight: 206 lb (93.4 kg)  Height: 5\' 10"  (1.778 m)    Physical Exam  Constitutional: He is oriented to person, place, and time.  HENT:  Head: Normocephalic.  Right Ear: Tympanic membrane, external ear and ear canal normal.  Left Ear: Tympanic membrane, external ear and ear canal normal.  Nose: Nose normal.  Mouth/Throat: Uvula is midline, oropharynx is clear and moist and mucous membranes are normal. No oropharyngeal exudate, posterior oropharyngeal edema, posterior oropharyngeal erythema or tonsillar abscesses.  Eyes: Pupils are equal, round, and reactive to light. Conjunctivae and EOM are normal. Right eye exhibits no discharge. Left eye exhibits no discharge. No scleral icterus.  Neck: Normal range of motion. Neck supple. No JVD present. No tracheal deviation present. No thyromegaly present.  Cardiovascular: Normal rate, regular rhythm, normal heart sounds and intact distal pulses. Exam reveals no gallop and no friction rub.  No murmur heard. Pulmonary/Chest: Breath sounds normal. No respiratory distress. He has no wheezes. He has no rales.  Abdominal: Soft. Bowel sounds are normal. He exhibits no mass. There is no hepatosplenomegaly. There is no tenderness. There is no rebound, no guarding and no CVA tenderness.  Musculoskeletal: Normal range of motion. He exhibits no edema or tenderness.  Lymphadenopathy:    He has no cervical adenopathy.  Neurological: He is alert and oriented to person, place, and time. He has normal strength and normal reflexes. No cranial nerve deficit.  Skin: Skin is warm. No rash noted.  Nursing note and vitals reviewed.     Assessment & Plan  Problem List Items Addressed This Visit     None    Visit Diagnoses    Acute maxillary sinusitis, recurrence not specified    -  Primary   Relevant Medications   amoxicillin (AMOXIL) 500 MG capsule   benzonatate (TESSALON) 100 MG capsule   Bronchitis       Relevant Medications   benzonatate (TESSALON) 100 MG capsule      Meds ordered this encounter  Medications  . amoxicillin (AMOXIL) 500 MG capsule    Sig: Take 1 capsule (500 mg total) by mouth 3 (three) times daily.    Dispense:  30 capsule    Refill:  0  . benzonatate (TESSALON) 100 MG capsule    Sig: Take 1 capsule (100 mg total) by mouth 2 (two) times daily as needed for cough.    Dispense:  20 capsule    Refill:  0      Dr. Elizabeth Sauereanna Santita Hunsberger [facility]  10/11/17

## 2018-04-21 ENCOUNTER — Other Ambulatory Visit: Payer: Self-pay

## 2018-04-21 ENCOUNTER — Ambulatory Visit
Admission: EM | Admit: 2018-04-21 | Discharge: 2018-04-21 | Disposition: A | Discharge status: Home / Self Care | Payer: BC Managed Care – PPO | Attending: Family Medicine | Admitting: Family Medicine

## 2018-04-21 DIAGNOSIS — J0100 Acute maxillary sinusitis, unspecified: Secondary | ICD-10-CM | POA: Diagnosis not present

## 2018-04-21 MED ORDER — AMOXICILLIN-POT CLAVULANATE 875-125 MG PO TABS: 1.0000 | ORAL_TABLET | Freq: Two times a day (BID) | ORAL | 0 refills | Status: DC

## 2018-04-21 NOTE — Discharge Instructions (Addendum)
Take medication as prescribed. Rest. Drink plenty of fluids.  ° °Follow up with your primary care physician this week as needed. Return to Urgent care for new or worsening concerns.  ° °

## 2018-04-21 NOTE — ED Triage Notes (Signed)
Pt c/o having cough and sinus congestion for the past 8 days but states today he is having facial pain with it,.

## 2018-04-21 NOTE — ED Provider Notes (Signed)
[facility] ____________________________________________  Time seen: Approximately 3:00 PM  I have reviewed the triage vital signs and the nursing notes.   HISTORY  Chief Complaint Facial Pain   HPI Thomas Kirby is a 37 y.o. male seen for evaluation of 8 days of runny nose, nasal congestion, sinus drainage and cough.  Patient reports symptoms have continued though taking over-the-counter medication for cold symptoms and reports today having continued sinus pressure and tenderness to his cheeks.  Reports thick nasal drainage.  Continues to eat and drink well.  Occasional scratchy sore throat.  Denies known fevers.  His sons recently had some similar sickness.  Felt that this may have triggered by seasonal allergies.  Has continue to remain active.  Denies other aggravating alleviating factors.  Denies recent sickness or recent anabolic use.  States current symptoms feel consistent with previous sinus infection.  Duanne Limerick, MD: PCP   Past Medical History:  Diagnosis Date  . Allergy     Patient Active Problem List   Diagnosis Date Noted  . Diabetes mellitus screening 12/04/2015    Past Surgical History:  Procedure Laterality Date  . PILONIDAL CYST EXCISION    . SHOULDER ARTHROSCOPY Left      No current facility-administered medications for this encounter.   Current Outpatient Medications:  .  amoxicillin-clavulanate (AUGMENTIN) 875-125 MG tablet, Take 1 tablet by mouth every 12 (twelve) hours., Disp: 20 tablet, Rfl: 0  Allergies Mucinex [guaifenesin er]  Family History  Problem Relation Age of Onset  . Cancer Maternal Grandfather   . Diverticulitis Father   . Kidney cancer Neg Hx   . Kidney disease Neg Hx   . Prostate cancer Neg Hx     Social History Social History   Tobacco Use  . Smoking status: Never Smoker  . Smokeless tobacco: Former Neurosurgeon    Types: Snuff  Substance Use Topics  . Alcohol use: No    Alcohol/week: 0.0 standard drinks    . Drug use: No    Review of Systems Constitutional: No fever ENT: as above.  Cardiovascular: Denies chest pain. Respiratory: Denies shortness of breath. Gastrointestinal: No abdominal pain.   Musculoskeletal: Negative for back pain. Skin: Negative for rash.   ____________________________________________   PHYSICAL EXAM:  VITAL SIGNS: ED Triage Vitals  Enc Vitals Group     BP 04/21/18 1447 118/72     Pulse Rate 04/21/18 1447 62     Resp 04/21/18 1447 18     Temp 04/21/18 1447 97.6 F (36.4 C)     Temp Source 04/21/18 1447 Oral     SpO2 04/21/18 1447 99 %     Weight 04/21/18 1448 200 lb (90.7 kg)     Height 04/21/18 1448 5\' 11"  (1.803 m)     Head Circumference --      Peak Flow --      Pain Score 04/21/18 1447 5     Pain Loc --      Pain Edu? --      Excl. in GC? --     Constitutional: Alert and oriented. Well appearing and in no acute distress. Eyes: Conjunctivae are normal.  Head: Atraumatic.Mild tenderness to palpation bilateral frontal and moderate maxillary sinuses. No swelling. No erythema.   Ears: no erythema, normal TMs bilaterally.   Nose: nasal congestion with bilateral nasal turbinate erythema and edema.   Mouth/Throat: Mucous membranes are moist.  Oropharynx non-erythematous.No tonsillar swelling or exudate.  Neck: No stridor.  No cervical spine  tenderness to palpation. Hematological/Lymphatic/Immunilogical: No cervical lymphadenopathy. Cardiovascular: Normal rate, regular rhythm. Grossly normal heart sounds. Good peripheral circulation. Respiratory: Normal respiratory effort.  No retractions.  No wheezes, rales or rhonchi. Good air movement.  Musculoskeletal: Steady gait.  Neurologic:  Normal speech and language.  No gait instability. Skin:  Skin is warm, dry and intact. No rash noted. Psychiatric: Mood and affect are normal. Speech and behavior are normal.  ___________________________________________   LABS (all labs ordered are listed, but only  abnormal results are displayed)  Labs Reviewed - No data to display   PROCEDURES Procedures  ____________________________________   INITIAL IMPRESSION / ASSESSMENT AND PLAN / ED COURSE  Pertinent labs & imaging results that were available during my care of the patient were reviewed by me and considered in my medical decision making (see chart for details).  Well-appearing patient.  Suspect secondary sinusitis.  Will treat with oral Augmentin.  Encourage rest, fluids, supportive care, over-the-counter medications as needed.Discussed indication, risks and benefits of medications with patient.  Discussed follow up with Primary care physician this week. Discussed follow up and return parameters including no resolution or any worsening concerns. Patient verbalized understanding and agreed to plan.   ____________________________________________   FINAL CLINICAL IMPRESSION(S) / ED DIAGNOSES  Final diagnoses:  Acute maxillary sinusitis, recurrence not specified     ED Discharge Orders         Ordered    amoxicillin-clavulanate (AUGMENTIN) 875-125 MG tablet  Every 12 hours     04/21/18 1458           Note: This dictation was prepared with Dragon dictation along with smaller phrase technology. Any transcriptional errors that result from this process are unintentional.         Renford Dills, NP 04/21/18 [phone]

## 2019-02-06 ENCOUNTER — Ambulatory Visit (INDEPENDENT_AMBULATORY_CARE_PROVIDER_SITE_OTHER): Payer: BC Managed Care – PPO | Admitting: Family Medicine

## 2019-02-06 ENCOUNTER — Other Ambulatory Visit: Payer: Self-pay

## 2019-02-06 ENCOUNTER — Encounter: Payer: Self-pay | Admitting: Family Medicine

## 2019-02-06 VITALS — BP 120/80 | HR 60 | Ht 71.0 in | Wt 205.0 lb

## 2019-02-06 DIAGNOSIS — Z23 Encounter for immunization: Secondary | ICD-10-CM | POA: Diagnosis not present

## 2019-02-06 DIAGNOSIS — H10503 Unspecified blepharoconjunctivitis, bilateral: Secondary | ICD-10-CM

## 2019-02-06 MED ORDER — SULFACETAMIDE SODIUM 10 % OP OINT: TOPICAL_OINTMENT | Freq: Four times a day (QID) | OPHTHALMIC | 0 refills | Status: DC

## 2019-02-06 NOTE — Progress Notes (Signed)
Date:  02/06/2019   Name:  Thomas Kirby   DOB:  08-11-1980   MRN:  [id number]   Chief Complaint: Belepharitis (R) eyelid has been swollen x 3 days- bump on inside of eyelid. Feels better today, but still bothering him) and tdap  Conjunctivitis  The current episode started 2 days ago. The onset was gradual. The problem has been unchanged. The problem is mild. Relieved by: warm compression. Associated symptoms include eye itching, headaches and eye discharge. Pertinent negatives include no fever, no decreased vision, no double vision, no photophobia, no abdominal pain, no constipation, no diarrhea, no nausea, no congestion, no ear discharge, no ear pain, no hearing loss, no mouth sores, no rhinorrhea, no sore throat, no stridor, no swollen glands, no neck pain, no cough, no wheezing, no rash and no eye redness. The eye pain is mild. The right eye is affected.    Review of Systems  Constitutional: Negative for chills and fever.  HENT: Negative for congestion, drooling, ear discharge, ear pain, hearing loss, mouth sores, rhinorrhea and sore throat.   Eyes: Positive for discharge and itching. Negative for double vision, photophobia and redness.  Respiratory: Negative for cough, shortness of breath, wheezing and stridor.   Cardiovascular: Negative for chest pain, palpitations and leg swelling.  Gastrointestinal: Negative for abdominal pain, blood in stool, constipation, diarrhea and nausea.  Endocrine: Negative for polydipsia.  Genitourinary: Negative for dysuria, frequency, hematuria and urgency.  Musculoskeletal: Negative for back pain, myalgias and neck pain.  Skin: Negative for rash.  Allergic/Immunologic: Negative for environmental allergies.  Neurological: Positive for headaches. Negative for dizziness.  Hematological: Does not bruise/bleed easily.  Psychiatric/Behavioral: Negative for suicidal ideas. The patient is not nervous/anxious.     Patient Active Problem List   Diagnosis  Date Noted  . Diabetes mellitus screening 12/04/2015    Allergies  Allergen Reactions  . Mucinex [Guaifenesin Er]     Past Surgical History:  Procedure Laterality Date  . PILONIDAL CYST EXCISION    . SHOULDER ARTHROSCOPY Left     Social History   Tobacco Use  . Smoking status: Never Smoker  . Smokeless tobacco: Former NeurosurgeonUser    Types: Snuff  Substance Use Topics  . Alcohol use: No    Alcohol/week: 0.0 standard drinks  . Drug use: No     Medication list has been reviewed and updated.  No outpatient medications have been marked as taking for the 02/06/19 encounter (Office Visit) with Duanne LimerickJones, Zamere Pasternak C, MD.    [facility] 02/06/2019 10/11/2017 12/04/2015  PHQ - 2 Score 0 0 0  PHQ- 9 Score 0 0 -    BP Readings from Last 3 Encounters:  02/06/19 120/80  04/21/18 118/72  10/11/17 120/64    Physical Exam Vitals signs and nursing note reviewed.  HENT:     Head: Normocephalic.     Right Ear: External ear normal.     Left Ear: External ear normal.     Nose: Nose normal.  Eyes:     General: Lids are normal. Lids are everted, no foreign bodies appreciated. Vision grossly intact. Gaze aligned appropriately. No allergic shiner, visual field deficit or scleral icterus.       Right eye: No discharge.        Left eye: No discharge.     Conjunctiva/sclera:     Right eye: Right conjunctiva is injected.     Left eye: Left conjunctiva is injected.     Pupils: Pupils are  equal, round, and reactive to light.  Neck:     Musculoskeletal: Normal range of motion and neck supple.     Thyroid: No thyromegaly.     Vascular: No JVD.     Trachea: No tracheal deviation.  Cardiovascular:     Rate and Rhythm: Normal rate and regular rhythm.     Heart sounds: Normal heart sounds. No murmur. No friction rub. No gallop.   Pulmonary:     Effort: No respiratory distress.     Breath sounds: Normal breath sounds. No wheezing or rales.  Abdominal:     General: Bowel sounds are normal.      Palpations: Abdomen is soft. There is no mass.     Tenderness: There is no abdominal tenderness. There is no guarding or rebound.  Musculoskeletal: Normal range of motion.        General: No tenderness.  Lymphadenopathy:     Cervical: No cervical adenopathy.  Skin:    General: Skin is warm.     Findings: No rash.  Neurological:     Mental Status: He is alert and oriented to person, place, and time.     Cranial Nerves: No cranial nerve deficit.     Deep Tendon Reflexes: Reflexes are normal and symmetric.     Wt Readings from Last 3 Encounters:  02/06/19 205 lb (93 kg)  04/21/18 200 lb (90.7 kg)  10/11/17 206 lb (93.4 kg)    BP 120/80   Pulse 60   Ht 5\' 11"  (1.803 m)   Wt 205 lb (93 kg)   BMI 28.59 kg/m   Assessment and Plan:  1. Blepharoconjunctivitis of both eyes, unspecified blepharoconjunctivitis type New onset.  Persistent.  Exam is consistent with conjunctivitis bilateral.  There is a little bit of irritation along the right eyelash upper portion.  There is no foreign body noted underneath lid will initiate sulfa Supramid ophthalmic ointment. 2. Need for Tdap vaccination Discussed and administered. - Tdap vaccine greater than or equal to 7yo IM

## 2019-02-07 ENCOUNTER — Telehealth: Payer: Self-pay | Admitting: Family Medicine

## 2019-02-07 NOTE — Telephone Encounter (Signed)
Received call by Nurse Triage 02/07/19 at 630pm patient seen earlier today with blepharoconjunctivitis was rx sulfacetamide eyedrops bleph 10, notified by pharmacy that these were not available, nursing called to request alternative. I reviewed chart, and recommended standing order of Polytrim antibiotic eyedrops, nursing coverage to call in new eye drops.  Nobie Putnam, [facility] 02/07/2019, 6:50 PM

## 2019-05-20 ENCOUNTER — Encounter: Payer: Self-pay | Admitting: Family Medicine

## 2019-05-20 ENCOUNTER — Ambulatory Visit
Admission: RE | Admit: 2019-05-20 | Discharge: 2019-05-20 | Disposition: A | Discharge status: Home / Self Care | Payer: BC Managed Care – PPO | Source: Ambulatory Visit | Attending: Family Medicine | Admitting: Family Medicine

## 2019-05-20 ENCOUNTER — Ambulatory Visit
Admission: RE | Admit: 2019-05-20 | Discharge: 2019-05-20 | Disposition: A | Discharge status: Home / Self Care | Payer: BC Managed Care – PPO | Attending: Family Medicine | Admitting: Family Medicine

## 2019-05-20 ENCOUNTER — Ambulatory Visit: Payer: BC Managed Care – PPO | Admitting: Family Medicine

## 2019-05-20 ENCOUNTER — Other Ambulatory Visit: Payer: Self-pay

## 2019-05-20 VITALS — BP 100/60 | HR 58 | Ht 71.0 in | Wt 203.0 lb

## 2019-05-20 DIAGNOSIS — S20212A Contusion of left front wall of thorax, initial encounter: Secondary | ICD-10-CM

## 2019-05-20 MED ORDER — TRAMADOL HCL 50 MG PO TABS: 50.0000 mg | ORAL_TABLET | Freq: Four times a day (QID) | ORAL | 0 refills | Status: AC

## 2019-05-20 MED ORDER — MELOXICAM 15 MG PO TABS: 15.0000 mg | ORAL_TABLET | Freq: Every day | ORAL | 0 refills | Status: DC
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
## 2019-05-20 NOTE — Patient Instructions (Signed)
Rib Contusion A rib contusion is a deep bruise on your rib area. Contusions are the result of a blunt trauma that causes bleeding and injury to the tissues under the skin. A rib contusion may involve bruising of the ribs and of the skin and muscles in the area. The skin over the contusion may turn blue, purple, or yellow. Minor injuries will give you a painless contusion. More severe contusions may stay painful and swollen for a few weeks. What are the causes? This condition is usually caused by a blow, trauma, or direct force to an area of the body. This often occurs while playing contact sports. What are the signs or symptoms? Symptoms of this condition include:  Swelling and redness of the injured area.  Discoloration of the injured area.  Tenderness and soreness of the injured area.  Pain with or without movement. How is this diagnosed? This condition may be diagnosed based on:  Your symptoms and medical history.  A physical exam.  Imaging tests-such as an X-ray, CT scan, or MRI-to determine if there were internal injuries or broken bones (fractures). How is this treated? This condition may be treated with:  Rest. This is often the best treatment for a rib contusion.  Icing. This reduces swelling and inflammation.  Deep-breathing exercises. These may be recommended to reduce the risk for lung collapse and pneumonia.  Medicines. Over-the-counter or prescription medicines may be given to control pain.  Injection of a numbing medicine around the nerve near your injury (nerve block). Follow these instructions at home:     Medicines  Take over-the-counter and prescription medicines only as told by your health care provider.  Do not drive or use heavy machinery while taking prescription pain medicine.  If you are taking prescription pain medicine, take actions to prevent or treat constipation. Your health care provider may recommend that you: ? Drink enough fluid to keep  your urine pale yellow. ? Eat foods that are high in fiber, such as fresh fruits and vegetables, whole grains, and beans. ? Limit foods that are high in fat and processed sugars, such as fried or sweet foods. ? Take an over-the-counter or prescription medicine for constipation. Managing pain, stiffness, and swelling  If directed, put ice on the injured area: ? Put ice in a plastic bag. ? Place a towel between your skin and the bag. ? Leave the ice on for 20 minutes, 2-3 times a day.  Rest the injured area. Avoid strenuous activity and any activities or movements that cause pain. Be careful during activities and avoid bumping the injured area.  Do not lift anything that is heavier than 5 lb (2.3 kg), or the limit that you are told, until your health care provider says that it is safe. General instructions  Do not use any products that contain nicotine or tobacco, such as cigarettes and e-cigarettes. These can delay healing. If you need help quitting, ask your health care provider.  Do deep-breathing exercises as told by your health care provider.  If you were given an incentive spirometer, use it every 1-2 hours while you are awake, or as recommended by your health care provider. This device measures how well you are filling your lungs with each breath.  Keep all follow-up visits as told by your health care provider. This is important. Contact a health care provider if you have:  Increased bruising or swelling.  Pain that is not controlled with treatment.  A fever. Get help right away if   you:  Have difficulty breathing or shortness of breath.  Develop a continual cough or you cough up thick or bloody sputum.  Feel nauseous or you vomit.  Have pain in your abdomen. Summary  A rib contusion is a deep bruise on your rib area. Contusions are the result of a blunt trauma that causes bleeding and injury to the tissues under the skin.  The skin overlying the contusion may turn  blue, purple, or yellow. Minor injuries may give you a painless contusion. More severe contusions may stay painful and swollen for a few weeks.  Rest the injured area. Avoid strenuous activity and any activities or movements that cause pain. This information is not intended to replace advice given to you by your health care provider. Make sure you discuss any questions you have with your health care provider. Document Released: 03/01/2001 Document Revised: 07/05/2017 Document Reviewed: 07/05/2017 Elsevier Patient Education  [place]. Rib Fracture  A rib fracture is a break or crack in one of the bones of the ribs. The ribs are long, curved bones that wrap around your chest and attach to your spine and your breastbone. The ribs protect your heart, lungs, and other organs in the chest. A broken or cracked rib is often painful but is not usually serious. Most rib fractures heal on their own over time. However, rib fractures can be more serious if multiple ribs are broken or if broken ribs move out of place and push against other structures or organs. What are the causes? This condition is caused by:  Repetitive movements with high force, such as pitching a baseball or having severe coughing spells.  A direct blow to the chest, such as a sports injury, a car accident, or a fall.  Cancer that has spread to the bones, which can weaken bones and cause them to break. What are the signs or symptoms? Symptoms of this condition include:  Pain when you breathe in or cough.  Pain when someone presses on the injured area.  Feeling short of breath. How is this diagnosed? This condition is diagnosed with a physical exam and medical history. Imaging tests may also be done, such as:  Chest X-ray.  CT scan.  MRI.  Bone scan.  Chest ultrasound. How is this treated? Treatment for this condition depends on the severity of the fracture. Most rib fractures usually heal on their own in 1-3  months. Sometimes healing takes longer if there is a cough that does not stop or if there are other activities that make the injury worse (aggravating factors). While you heal, you will be given medicines to control the pain. You will also be taught deep breathing exercises. Severe injuries may require hospitalization or surgery. Follow these instructions at home: Managing pain, stiffness, and swelling  If directed, apply ice to the injured area. ? Put ice in a plastic bag. ? Place a towel between your skin and the bag. ? Leave the ice on for 20 minutes, 2-3 times a day.  Take over-the-counter and prescription medicines only as told by your health care provider. Activity  Avoid a lot of activity and any activities or movements that cause pain. Be careful during activities and avoid bumping the injured rib.  Slowly increase your activity as told by your health care provider. General instructions  Do deep breathing exercises as told by your health care provider. This helps prevent pneumonia, which is a common complication of a broken rib. Your health care provider may  instruct you to: ? Take deep breaths several times a day. ? Try to cough several times a day, holding a pillow against the injured area. ? Use a device called incentive spirometer to practice deep breathing several times a day.  Drink enough fluid to keep your urine pale yellow.  Do not wear a rib belt or binder. These restrict breathing, which can lead to pneumonia.  Keep all follow-up visits as told by your health care provider. This is important. Contact a health care provider if:  You have a fever. Get help right away if:  You have difficulty breathing or you are short of breath.  You develop a cough that does not stop, or you cough up thick or bloody sputum.  You have nausea, vomiting, or pain in your abdomen.  Your pain gets worse and medicine does not help. Summary  A rib fracture is a break or crack in  one of the bones of the ribs.  A broken or cracked rib is often painful but is not usually serious.  Most rib fractures heal on their own over time.  Treatment for this condition depends on the severity of the fracture.  Avoid a lot of activity and any activities or movements that cause pain. This information is not intended to replace advice given to you by your health care provider. Make sure you discuss any questions you have with your health care provider. Document Released: 06/06/2005 Document Revised: 05/19/2017 Document Reviewed: 09/05/2016 Elsevier Patient Education  2020 [employer].

## 2020-06-02 ENCOUNTER — Ambulatory Visit (INDEPENDENT_AMBULATORY_CARE_PROVIDER_SITE_OTHER): Payer: BC Managed Care – PPO | Admitting: Family Medicine

## 2020-06-02 ENCOUNTER — Encounter: Payer: Self-pay | Admitting: Family Medicine

## 2020-06-02 ENCOUNTER — Other Ambulatory Visit: Payer: Self-pay

## 2020-06-02 VITALS — BP 110/62 | HR 56 | Ht 71.0 in | Wt 204.0 lb

## 2020-06-02 DIAGNOSIS — Z0000 Encounter for general adult medical examination without abnormal findings: Secondary | ICD-10-CM | POA: Diagnosis not present

## 2020-06-02 LAB — HEMOCCULT GUIAC POC 1CARD (OFFICE): Fecal Occult Blood, POC: NEGATIVE

## 2020-06-02 LAB — POCT URINALYSIS DIPSTICK
Bilirubin, UA: NEGATIVE
Blood, UA: NEGATIVE
Glucose, UA: NEGATIVE
Ketones, UA: NEGATIVE
Leukocytes, UA: NEGATIVE
Nitrite, UA: NEGATIVE
Protein, UA: NEGATIVE
Spec Grav, UA: 1.01 (ref 1.010–1.025)
Urobilinogen, UA: 0.2 E.U./dL
pH, UA: 5 (ref 5.0–8.0)

## 2020-06-02 NOTE — Progress Notes (Signed)
Date:  06/02/2020   Name:  Thomas Kirby   DOB:  July 23, 1980   MRN:  [id number]   Chief Complaint: Annual Exam  Patient is a 39 year old male who presents for a comprehensive physical exam. The patient reports the following problems: none. Health maintenance has been reviewed up to date.   Lab Results  Component Value Date   CREATININE 1.11 12/04/2015   BUN 23 (H) 12/04/2015   NA 139 12/04/2015   K 4.5 12/04/2015   CL 100 12/04/2015   CO2 24 12/04/2015   Lab Results  Component Value Date   CHOL 184 12/04/2015   HDL 55 12/04/2015   LDLCALC 115 (H) 12/04/2015   TRIG 71 12/04/2015   CHOLHDL 3.3 12/04/2015   No results found for: TSH No results found for: HGBA1C No results found for: WBC, HGB, HCT, MCV, PLT No results found for: ALT, AST, GGT, ALKPHOS, BILITOT   Review of Systems  Constitutional: Negative for chills, fatigue and fever.  HENT: Negative for drooling, ear discharge, ear pain and sore throat.   Respiratory: Negative for cough, shortness of breath and wheezing.   Cardiovascular: Negative for chest pain, palpitations and leg swelling.  Gastrointestinal: Negative for abdominal pain, blood in stool, constipation, diarrhea and nausea.  Endocrine: Negative for polydipsia.  Genitourinary: Negative for dysuria, frequency, hematuria and urgency.  Musculoskeletal: Negative for back pain, myalgias and neck pain.  Skin: Negative for rash.  Allergic/Immunologic: Negative for environmental allergies.  Neurological: Negative for dizziness and headaches.  Hematological: Does not bruise/bleed easily.  Psychiatric/Behavioral: Negative for suicidal ideas. The patient is not nervous/anxious.     Patient Active Problem List   Diagnosis Date Noted  . Diabetes mellitus screening 12/04/2015    Allergies  Allergen Reactions  . Mucinex [Guaifenesin Er]     Past Surgical History:  Procedure Laterality Date  . PILONIDAL CYST EXCISION    . SHOULDER ARTHROSCOPY Left      Social History   Tobacco Use  . Smoking status: Never Smoker  . Smokeless tobacco: Former Neurosurgeon    Types: Snuff  Vaping Use  . Vaping Use: Never used  Substance Use Topics  . Alcohol use: No    Alcohol/week: 0.0 standard drinks  . Drug use: No     Medication list has been reviewed and updated.  No outpatient medications have been marked as taking for the 06/02/20 encounter (Office Visit) with Duanne Limerick, MD.    [facility] 2/9 Scores 06/02/2020 02/06/2019 10/11/2017 12/04/2015  PHQ - 2 Score 0 0 0 0  PHQ- 9 Score 0 0 0 -    GAD 7 : Generalized Anxiety Score 06/02/2020  Nervous, Anxious, on Edge 0  Control/stop worrying 0  Worry too much - different things 0  Trouble relaxing 0  Restless 0  Easily annoyed or irritable 0  Afraid - awful might happen 0  Total GAD 7 Score 0    BP Readings from Last 3 Encounters:  06/02/20 110/62  05/20/19 100/60  02/06/19 120/80    Physical Exam Vitals and nursing note reviewed.  Constitutional:      Appearance: Normal appearance. He is well-groomed and normal weight.  HENT:     Head: Normocephalic.     Jaw: There is normal jaw occlusion.     Right Ear: Hearing, tympanic membrane, ear canal and external ear normal.     Left Ear: Hearing, tympanic membrane, ear canal and external ear normal.  Nose: Nose normal. No congestion or rhinorrhea.     Mouth/Throat:     Lips: Pink.     Mouth: Oropharynx is clear and moist. Mucous membranes are moist.     Dentition: Normal dentition.     Pharynx: Uvula midline.  Eyes:     General: Lids are normal. Vision grossly intact. No scleral icterus.       Right eye: No discharge.        Left eye: No discharge.     Extraocular Movements: EOM normal.     Conjunctiva/sclera: Conjunctivae normal.     Pupils: Pupils are equal, round, and reactive to light.  Neck:     Thyroid: No thyroid mass, thyromegaly or thyroid tenderness.     Vascular: Normal carotid pulses. No carotid bruit, hepatojugular  reflux or JVD.     Trachea: Trachea and phonation normal. No tracheal deviation.  Cardiovascular:     Rate and Rhythm: Normal rate and regular rhythm.     Chest Wall: PMI is not displaced.     Pulses: Normal pulses and intact distal pulses.          Carotid pulses are 2+ on the right side and 2+ on the left side.      Radial pulses are 2+ on the right side and 2+ on the left side.       Femoral pulses are 2+ on the right side and 2+ on the left side.      Popliteal pulses are 2+ on the right side and 2+ on the left side.       Dorsalis pedis pulses are 2+ on the right side and 2+ on the left side.       Posterior tibial pulses are 2+ on the right side and 2+ on the left side.     Heart sounds: Normal heart sounds, S1 normal and S2 normal. No murmur heard.  No systolic murmur is present.  No diastolic murmur is present. No friction rub. No gallop. No S3 or S4 sounds.   Pulmonary:     Effort: No respiratory distress.     Breath sounds: Normal breath sounds. No decreased breath sounds, wheezing, rhonchi or rales.  Chest:     Chest wall: No mass.  Breasts: Breasts are symmetrical.     Right: Normal. No axillary adenopathy or supraclavicular adenopathy.     Left: Normal. No axillary adenopathy or supraclavicular adenopathy.    Abdominal:     General: Bowel sounds are normal.     Palpations: Abdomen is soft. There is no hepatomegaly, splenomegaly, hepatosplenomegaly or mass.     Tenderness: There is no abdominal tenderness. There is no CVA tenderness, right CVA tenderness, left CVA tenderness, guarding or rebound.     Hernia: No hernia is present. There is no hernia in the umbilical area, ventral area, left inguinal area or right inguinal area.  Genitourinary:    Penis: Normal and uncircumcised.      Testes: Normal.        Right: Mass, tenderness or swelling not present.        Left: Mass, tenderness or swelling not present.     Epididymis:     Right: Normal.     Left: Normal.      Prostate: Normal. Not enlarged, not tender and no nodules present.     Rectum: Normal. Guaiac result negative. No mass.  Musculoskeletal:        General: No tenderness or edema. Normal range of  motion.     Cervical back: Normal, full passive range of motion without pain, normal range of motion and neck supple.     Thoracic back: Normal.     Lumbar back: Normal.     Right lower leg: No edema.     Left lower leg: No edema.  Lymphadenopathy:     Head:     Right side of head: No submandibular or tonsillar adenopathy.     Left side of head: No submandibular or tonsillar adenopathy.     Cervical: No cervical adenopathy.     Right cervical: No superficial, deep or posterior cervical adenopathy.    Left cervical: No superficial, deep or posterior cervical adenopathy.     Upper Body:     Right upper body: No supraclavicular or axillary adenopathy.     Left upper body: No supraclavicular or axillary adenopathy.     Lower Body: No right inguinal adenopathy. No left inguinal adenopathy.  Skin:    General: Skin is warm.     Findings: No rash.  Neurological:     Mental Status: He is alert and oriented to person, place, and time.     Cranial Nerves: Cranial nerves are intact. No cranial nerve deficit.     Sensory: Sensation is intact.     Motor: Motor function is intact.     Deep Tendon Reflexes: Strength normal and reflexes are normal and symmetric.     Reflex Scores:      Tricep reflexes are 2+ on the right side and 2+ on the left side.      Bicep reflexes are 2+ on the right side and 2+ on the left side.      Brachioradialis reflexes are 2+ on the right side and 2+ on the left side.      Patellar reflexes are 2+ on the right side and 2+ on the left side.      Achilles reflexes are 2+ on the right side and 2+ on the left side. Psychiatric:        Behavior: Behavior is cooperative.     Wt Readings from Last 3 Encounters:  06/02/20 204 lb (92.5 kg)  05/20/19 203 lb (92.1 kg)  02/06/19 205  lb (93 kg)    BP 110/62   Pulse (!) 56   Ht 5\' 11"  (1.803 m)   Wt 204 lb (92.5 kg)   BMI 28.45 kg/m   Assessment and Plan:  1. Annual physical exam No subjective/objective concerns noted during history and physical exam today.Nicholos Aloisi is a 39 y.o. male who presents today for his Complete Annual Exam. He feels well. He reports exercising . He reports he is sleeping well. Immunizations are reviewed and recommendations provided.   Age appropriate screening tests are discussed. Counseling given for risk factor reduction interventions.  We will obtain a lipid panel renal function panel.  Hemoccult card was negative and urinalysis was unremarkable. - Lipid Panel With LDL/HDL Ratio - Renal Function Panel - POCT occult blood stool - POCT urinalysis dipstick

## 2020-06-03 LAB — LIPID PANEL WITH LDL/HDL RATIO
Cholesterol, Total: 166 mg/dL (ref 100–199)
HDL: 67 mg/dL (ref >39)
LDL Chol Calc (NIH): 91 mg/dL (ref 0–99)
LDL/HDL Ratio: 1.4 ratio (ref 0.0–3.6)
Triglycerides: 33 mg/dL (ref 0–149)
VLDL Cholesterol Cal: 8 mg/dL (ref 5–40)

## 2020-06-03 LAB — RENAL FUNCTION PANEL
Albumin: 4.6 g/dL (ref 4.0–5.0)
BUN/Creatinine Ratio: 24 — ABNORMAL HIGH (ref 9–20)
BUN: 24 mg/dL (ref 6–24)
CO2: 23 mmol/L (ref 20–29)
Calcium: 9.6 mg/dL (ref 8.7–10.2)
Chloride: 106 mmol/L (ref 96–106)
Creatinine, Ser: 0.98 mg/dL (ref 0.76–1.27)
GFR calc Af Amer: 111 mL/min/{1.73_m2} (ref >59)
GFR calc non Af Amer: 96 mL/min/{1.73_m2} (ref >59)
Glucose: 83 mg/dL (ref 65–99)
Phosphorus: 3.1 mg/dL (ref 2.8–4.1)
Potassium: 4.5 mmol/L (ref 3.5–5.2)
Sodium: 143 mmol/L (ref 134–144)

## 2020-10-15 ENCOUNTER — Ambulatory Visit
Admission: RE | Admit: 2020-10-15 | Discharge: 2020-10-15 | Disposition: A | Discharge status: Home / Self Care | Payer: BC Managed Care – PPO | Source: Ambulatory Visit | Attending: Emergency Medicine | Admitting: Emergency Medicine

## 2020-10-15 ENCOUNTER — Other Ambulatory Visit: Payer: Self-pay

## 2020-10-15 VITALS — BP 108/75 | HR 73 | Temp 99.1°F | Resp 18

## 2020-10-15 DIAGNOSIS — Z20822 Contact with and (suspected) exposure to covid-19: Secondary | ICD-10-CM | POA: Diagnosis not present

## 2020-10-15 DIAGNOSIS — J069 Acute upper respiratory infection, unspecified: Secondary | ICD-10-CM | POA: Diagnosis not present

## 2020-10-15 LAB — RESP PANEL BY RT-PCR (FLU A&B, COVID) ARPGX2
Influenza A by PCR: NEGATIVE
Influenza B by PCR: NEGATIVE
SARS Coronavirus 2 by RT PCR: NEGATIVE

## 2020-10-15 MED ORDER — PROMETHAZINE-DM 6.25-15 MG/5ML PO SYRP: 5.0000 mL | ORAL_SOLUTION | Freq: Four times a day (QID) | ORAL | 0 refills | Status: DC | PRN

## 2020-10-15 MED ORDER — IPRATROPIUM BROMIDE 0.06 % NA SOLN: 2.0000 | Freq: Four times a day (QID) | NASAL | 12 refills | Status: DC

## 2020-10-15 MED ORDER — BENZONATATE 100 MG PO CAPS: 200.0000 mg | ORAL_CAPSULE | Freq: Three times a day (TID) | ORAL | 0 refills | Status: DC

## 2020-10-15 NOTE — Discharge Instructions (Signed)

## 2020-10-15 NOTE — ED Triage Notes (Signed)
Pt is present today with bilateral ear pain, cough, fever, and chest congestion. Pt states that sx started two days. Ago

## 2020-10-15 NOTE — ED Provider Notes (Signed)
[facility]    CSN: [id number] Arrival date & time: 10/15/20  1028      History   Chief Complaint Chief Complaint  Patient presents with  . Cough  . Fever  . Nasal Congestion    HPI Thomas Kirby is a 40 y.o. male.   HPI   40 year old male here for evaluation of respiratory complaints.  Patient reports that for the last 2 days he has been suffering with bilateral ear pain, productive cough, runny nose, nasal congestion, pain in his jaw, shortness of breath, and wheezing.  He developed a fever today along with body aches.  His T-max was 101 and he took Tylenol.  Patient denies ringing in his ears, changes to hearing, GI complaints, or any known COVID exposure.  Patient does have kids in his class that have been sick.  Patient has been vaccinated for COVID and received his booster shot but he does not remember getting his flu shot.  Past Medical History:  Diagnosis Date  . Allergy     Patient Active Problem List   Diagnosis Date Noted  . Diabetes mellitus screening 12/04/2015    Past Surgical History:  Procedure Laterality Date  . PILONIDAL CYST EXCISION    . SHOULDER ARTHROSCOPY Left        Home Medications    Prior to Admission medications   Medication Sig Start Date End Date Taking? Authorizing Provider  benzonatate (TESSALON) 100 MG capsule Take 2 capsules (200 mg total) by mouth every 8 (eight) hours. 10/15/20  Yes Becky Augusta, NP  ipratropium (ATROVENT) 0.06 % nasal spray Place 2 sprays into both nostrils 4 (four) times daily. 10/15/20  Yes Becky Augusta, NP  promethazine-dextromethorphan (PROMETHAZINE-DM) 6.25-15 MG/5ML syrup Take 5 mLs by mouth 4 (four) times daily as needed. 10/15/20  Yes Becky Augusta, NP    Family History Family History  Problem Relation Age of Onset  . Cancer Maternal Grandfather   . Diverticulitis Father   . Kidney cancer Neg Hx   . Kidney disease Neg Hx   . Prostate cancer Neg Hx     Social History Social  History   Tobacco Use  . Smoking status: Never Smoker  . Smokeless tobacco: Former Neurosurgeon    Types: Snuff  Vaping Use  . Vaping Use: Never used  Substance Use Topics  . Alcohol use: No    Alcohol/week: 0.0 standard drinks  . Drug use: No     Allergies   Mucinex [guaifenesin er]   Review of Systems Review of Systems  Constitutional: Positive for fever. Negative for activity change and appetite change.  HENT: Positive for congestion, ear pain, rhinorrhea and sore throat. Negative for tinnitus.   Respiratory: Positive for cough, shortness of breath and wheezing.   Gastrointestinal: Negative for diarrhea, nausea and vomiting.  Musculoskeletal: Positive for arthralgias and neck pain.  Skin: Negative for rash.  Hematological: Negative.   Psychiatric/Behavioral: Negative.      Physical Exam Triage Vital Signs ED Triage Vitals  Enc Vitals Group     BP 10/15/20 1112 108/75     Pulse Rate 10/15/20 1112 73     Resp 10/15/20 1112 18     Temp 10/15/20 1112 99.1 F (37.3 C)     Temp Source 10/15/20 1112 Oral     SpO2 10/15/20 1112 96 %     Weight --      Height --      Head Circumference --  Peak Flow --      Pain Score 10/15/20 1110 5     Pain Loc --      Pain Edu? --      Excl. in GC? --    No data found.  Updated Vital Signs BP 108/75 (BP Location: Left Arm)   Pulse 73   Temp 99.1 F (37.3 C) (Oral)   Resp 18   SpO2 96%   Visual Acuity Right Eye Distance:   Left Eye Distance:   Bilateral Distance:    Right Eye Near:   Left Eye Near:    Bilateral Near:     Physical Exam Vitals and nursing note reviewed.  Constitutional:      General: He is not in acute distress.    Appearance: Normal appearance. He is ill-appearing.  HENT:     Head: Normocephalic and atraumatic.     Right Ear: Tympanic membrane, ear canal and external ear normal. There is no impacted cerumen.     Left Ear: Tympanic membrane, ear canal and external ear normal. There is no impacted  cerumen.     Nose: Congestion and rhinorrhea present.     Mouth/Throat:     Mouth: Mucous membranes are moist.     Pharynx: Oropharynx is clear. Posterior oropharyngeal erythema present.  Cardiovascular:     Rate and Rhythm: Normal rate and regular rhythm.     Pulses: Normal pulses.     Heart sounds: Normal heart sounds. No murmur heard. No gallop.   Pulmonary:     Effort: Pulmonary effort is normal.     Breath sounds: Normal breath sounds. No wheezing, rhonchi or rales.  Musculoskeletal:     Cervical back: Normal range of motion and neck supple.  Lymphadenopathy:     Cervical: Cervical adenopathy present.  Skin:    General: Skin is warm and dry.     Capillary Refill: Capillary refill takes less than 2 seconds.     Findings: No erythema or rash.  Neurological:     General: No focal deficit present.     Mental Status: He is alert and oriented to person, place, and time.  Psychiatric:        Mood and Affect: Mood normal.        Behavior: Behavior normal.        Thought Content: Thought content normal.        Judgment: Judgment normal.      UC Treatments / Results  Labs (all labs ordered are listed, but only abnormal results are displayed) Labs Reviewed  RESP PANEL BY RT-PCR (FLU A&B, COVID) [id number]    EKG   Radiology No results found.  Procedures Procedures (including critical care time)  Medications Ordered in UC Medications - No data to display  Initial Impression / Assessment and Plan / UC Course  I have reviewed the triage vital signs and the nursing notes.  Pertinent labs & imaging results that were available during my care of the patient were reviewed by me and considered in my medical decision making (see chart for details).   Patient is a very pleasant but ill-appearing 40 year old male here for evaluation of upper and lower respiratory complaints that been going on for the last 2 days.  Patient developed a fever today with a T-max of 101.  Patient's  physical exam reveals bilateral pearly gray tympanic membranes with a normal light reflex and clear external auditory canals.  The mucosa is erythematous and edematous with clear nasal discharge.  Posterior oropharynx has erythema and clear postnasal drip.  There is no tonsillar swelling, erythema, or exudate.  Patient does have shotty bilateral anterior cervical lymphadenopathy.  Lungs are clear to auscultation all fields.  Will send respiratory triplex panel.  Respiratory triplex panel is negative for influenza and COVID.  We will discharge patient home with a diagnosis of URI to use over-the-counter Tylenol and ibuprofen as needed for pain.  We will give Tessalon Perles and Promethazine DM cough syrup to assist with cough.  We will also give ipratropium nasal spray to help with nasal congestion and runny nose.  Work note provided.   Final Clinical Impressions(s) / UC Diagnoses   Final diagnoses:  Viral URI with cough     Discharge Instructions     Use the Atrovent nasal spray, 2 squirts in each nostril every 6 hours, as needed for runny nose and postnasal drip.  Use the Tessalon Perles every 8 hours during the day.  Take them with a small sip of water.  They may give you some numbness to the base of your tongue or a metallic taste in your mouth, this is normal.  Use the Promethazine DM cough syrup at bedtime for cough and congestion.  It will make you drowsy so do not take it during the day.  Return for reevaluation or see your primary care provider for any new or worsening symptoms.     ED Prescriptions    Medication Sig Dispense Auth. Provider   benzonatate (TESSALON) 100 MG capsule Take 2 capsules (200 mg total) by mouth every 8 (eight) hours. 21 capsule Becky Augusta, NP   promethazine-dextromethorphan (PROMETHAZINE-DM) 6.25-15 MG/5ML syrup Take 5 mLs by mouth 4 (four) times daily as needed. 118 mL Becky Augusta, NP   ipratropium (ATROVENT) 0.06 % nasal spray Place 2 sprays into  both nostrils 4 (four) times daily. 15 mL Becky Augusta, NP     PDMP not reviewed this encounter.   Becky Augusta, NP 10/15/20 1240

## 2020-10-16 ENCOUNTER — Encounter: Payer: Self-pay | Admitting: Family Medicine

## 2020-10-19 ENCOUNTER — Ambulatory Visit (INDEPENDENT_AMBULATORY_CARE_PROVIDER_SITE_OTHER): Payer: Self-pay | Admitting: Family Medicine

## 2020-10-19 DIAGNOSIS — J0100 Acute maxillary sinusitis, unspecified: Secondary | ICD-10-CM

## 2020-10-19 MED ORDER — AMOXICILLIN-POT CLAVULANATE 875-125 MG PO TABS
1.0000 | ORAL_TABLET | Freq: Two times a day (BID) | ORAL | 0 refills | Status: DC
Start: 2020-10-19 — End: 2021-06-16

## 2020-10-19 NOTE — Progress Notes (Signed)
Date:  10/19/2020   Name:  Thomas Kirby   DOB:  August 31, 1980   MRN:  [id number]   Chief Complaint: Sinusitis (Went to UC on Thurs., tested for COVID and Flu- both neg. Still having head pressure, teeth hurting, yellow production. Was given nasal spray and Tessalon Perles, Promethazine- helped some. )  Elizabeth Sauer MD connected withthis patient, Gerald Kuehl, by telephoneat the patient's home.  I verified that I am speaking with the correct person using two identifiers. This visit was conducted via telephone due to the Covid-19 outbreak from my office at [facility] in [place], [place]. I discussed the limitations, risks, security and privacy concerns of performing an evaluation and management service by telephone. I also discussed with the patient that there may be a patient responsible charge related to this service. The patient expressed understanding and agreed to proceed.  Sinusitis This is a new problem. The current episode started 1 to 4 weeks ago (8 days worse wednesday). The problem has been waxing and waning since onset. Maximum temperature: 2 dasys. The fever has been present for 1 to 2 days. The pain is moderate. Associated symptoms include congestion, headaches, a hoarse voice, shortness of breath, sinus pressure, sneezing, a sore throat and swollen glands. Pertinent negatives include no chills, coughing, diaphoresis, ear pain or neck pain. Past treatments include oral decongestants and acetaminophen (cough med). The treatment provided no relief.    Lab Results  Component Value Date   CREATININE 0.98 06/02/2020   BUN 24 06/02/2020   NA 143 06/02/2020   K 4.5 06/02/2020   CL 106 06/02/2020   CO2 23 06/02/2020   Lab Results  Component Value Date   CHOL 166 06/02/2020   HDL 67 06/02/2020   LDLCALC 91 06/02/2020   TRIG 33 06/02/2020   CHOLHDL 3.3 12/04/2015   No results found for: TSH No results found for: HGBA1C No results found for: WBC, HGB, HCT, MCV, PLT No  results found for: ALT, AST, GGT, ALKPHOS, BILITOT   Review of Systems  Constitutional: Negative for chills and diaphoresis.  HENT: Positive for congestion, hoarse voice, postnasal drip, rhinorrhea, sinus pressure, sneezing and sore throat. Negative for dental problem, drooling, ear discharge, ear pain, facial swelling, hearing loss, mouth sores, nosebleeds, sinus pain, tinnitus, trouble swallowing and voice change.   Respiratory: Positive for shortness of breath. Negative for cough.   Musculoskeletal: Negative for neck pain.  Neurological: Positive for headaches.    Patient Active Problem List   Diagnosis Date Noted  . Diabetes mellitus screening 12/04/2015    Allergies  Allergen Reactions  . Mucinex [Guaifenesin Er]     Past Surgical History:  Procedure Laterality Date  . PILONIDAL CYST EXCISION    . SHOULDER ARTHROSCOPY Left     Social History   Tobacco Use  . Smoking status: Never Smoker  . Smokeless tobacco: Former Neurosurgeon    Types: Snuff  Vaping Use  . Vaping Use: Never used  Substance Use Topics  . Alcohol use: No    Alcohol/week: 0.0 standard drinks  . Drug use: No     Medication list has been reviewed and updated.  Current Meds  Medication Sig  . benzonatate (TESSALON) 100 MG capsule Take 2 capsules (200 mg total) by mouth every 8 (eight) hours.  Marland Kitchen ipratropium (ATROVENT) 0.06 % nasal spray Place 2 sprays into both nostrils 4 (four) times daily.  . promethazine-dextromethorphan (PROMETHAZINE-DM) 6.25-15 MG/5ML syrup Take 5 mLs by mouth 4 (four)  times daily as needed. (Patient taking differently: Take 5 mLs by mouth 4 (four) times daily as needed.)    PHQ 2/9 Scores 10/19/2020 06/02/2020 02/06/2019 10/11/2017  PHQ - 2 Score 0 0 0 0  PHQ- 9 Score 0 0 0 0    GAD 7 : Generalized Anxiety Score 10/19/2020 06/02/2020  Nervous, Anxious, on Edge 0 0  Control/stop worrying 0 0  Worry too much - different things 0 0  Trouble relaxing 0 0  Restless 0 0  Easily annoyed  or irritable 0 0  Afraid - awful might happen 0 0  Total GAD 7 Score 0 0    BP Readings from Last 3 Encounters:  10/15/20 108/75  06/02/20 110/62  05/20/19 100/60    Physical Exam Nursing note reviewed.  HENT:     Nose:     Right Sinus: Maxillary sinus tenderness present.     Left Sinus: Maxillary sinus tenderness present.  Neurological:     Mental Status: He is alert.     Wt Readings from Last 3 Encounters:  06/02/20 204 lb (92.5 kg)  05/20/19 203 lb (92.1 kg)  02/06/19 205 lb (93 kg)    There were no vitals taken for this visit.  Assessment and Plan: 1. Acute non-recurrent maxillary sinusitis Acute.  Persistent.  Stable.  Patient was evaluated in an urgent care situation last week was giving medication for viral allergic upper respiratory concern.  There has not been resolution of the symptoms including productive upper nasal discharge which is purulent in nature.  Given the progression we will initiate Augmentin 875 mg twice a day for 10 days and patient will continue with previous medications as well. - amoxicillin-clavulanate (AUGMENTIN) 875-125 MG tablet; Take 1 tablet by mouth 2 (two) times daily.  Dispense: 20 tablet; Refill: 0  I spent 8 minutes with this patient, More than 50% of that time was spent in face to face education, counseling and care coordination.
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
## 2021-01-07 ENCOUNTER — Encounter: Payer: Self-pay | Admitting: Family Medicine

## 2021-04-30 ENCOUNTER — Telehealth: Payer: BC Managed Care – PPO | Admitting: Family Medicine

## 2021-06-04 ENCOUNTER — Encounter: Payer: BC Managed Care – PPO | Admitting: Family Medicine

## 2021-06-16 ENCOUNTER — Other Ambulatory Visit: Payer: Self-pay

## 2021-06-16 ENCOUNTER — Ambulatory Visit: Payer: BC Managed Care – PPO | Admitting: Family Medicine

## 2021-06-16 ENCOUNTER — Ambulatory Visit
Admission: RE | Admit: 2021-06-16 | Discharge: 2021-06-16 | Disposition: A | Discharge status: Home / Self Care | Payer: BC Managed Care – PPO | Source: Ambulatory Visit | Attending: Family Medicine | Admitting: Family Medicine

## 2021-06-16 ENCOUNTER — Encounter: Payer: Self-pay | Admitting: Family Medicine

## 2021-06-16 ENCOUNTER — Ambulatory Visit
Admission: RE | Admit: 2021-06-16 | Discharge: 2021-06-16 | Disposition: A | Discharge status: Home / Self Care | Payer: BC Managed Care – PPO | Attending: Family Medicine | Admitting: Family Medicine

## 2021-06-16 VITALS — BP 128/82 | HR 72 | Ht 71.0 in | Wt 214.0 lb

## 2021-06-16 DIAGNOSIS — R079 Chest pain, unspecified: Secondary | ICD-10-CM

## 2021-06-16 DIAGNOSIS — R0600 Dyspnea, unspecified: Secondary | ICD-10-CM

## 2021-06-16 DIAGNOSIS — R002 Palpitations: Secondary | ICD-10-CM

## 2021-06-16 NOTE — Progress Notes (Signed)
° ° °Date:  06/16/2021  ° °Name:  Thomas Kirby   DOB:  07/17/1980   MRN:  [id number] ° ° °Chief Complaint: Palpitations ° °Palpitations  °This is a new problem. The current episode started 1 to 4 weeks ago (2 weeks prior). The problem occurs every several days. The problem has been unchanged (rate 85-100). On average, each episode lasts 12 minutes. The symptoms are aggravated by caffeine (sport drink/COVID 7/22). Associated symptoms include chest pain and shortness of breath. Pertinent negatives include no anxiety, coughing, diaphoresis, dizziness, fever, nausea or near-syncope. Associated symptoms comments: tight. He has tried deep relaxation and breathing exercises for the symptoms. The treatment provided mild ("may help") relief. Risk factors include being male.  °Shortness of Breath °This is a new problem. The current episode started 1 to 4 weeks ago. The problem occurs intermittently. The problem has been gradually improving. The average episode lasts 12 minutes. Associated symptoms include chest pain. Pertinent negatives include no abdominal pain, ear pain, fever, headaches, leg swelling, neck pain, orthopnea, PND, rash, sore throat or wheezing. The symptoms are aggravated by emotional upset. The patient has no known risk factors for DVT/PE. He has tried nothing for the symptoms.  °Chest Pain  °This is a new problem. The current episode started 1 to 4 weeks ago. The onset quality is gradual. The pain is present in the substernal region. The pain is at a severity of 7/10. The pain is mild. The quality of the pain is described as tightness. The pain does not radiate. Associated symptoms include palpitations and shortness of breath. Pertinent negatives include no abdominal pain, back pain, cough, diaphoresis, dizziness, exertional chest pressure, fever, headaches, nausea, near-syncope, orthopnea or PND.  ° °Lab Results  °Component Value Date  ° NA 143 06/02/2020  ° K 4.5 06/02/2020  ° CO2 23 06/02/2020  °  GLUCOSE 83 06/02/2020  ° BUN 24 06/02/2020  ° CREATININE 0.98 06/02/2020  ° CALCIUM 9.6 06/02/2020  ° GFRNONAA 96 06/02/2020  ° °Lab Results  °Component Value Date  ° CHOL 166 06/02/2020  ° HDL 67 06/02/2020  ° LDLCALC 91 06/02/2020  ° TRIG 33 06/02/2020  ° CHOLHDL 3.3 12/04/2015  ° °No results found for: TSH °No results found for: HGBA1C °No results found for: WBC, HGB, HCT, MCV, PLT °No results found for: ALT, AST, GGT, ALKPHOS, BILITOT °No results found for: 25OHVITD2, 25OHVITD3, VD25OH  ° °Review of Systems  °Constitutional:  Negative for chills, diaphoresis and fever.  °HENT:  Negative for drooling, ear discharge, ear pain and sore throat.   °Respiratory:  Positive for shortness of breath. Negative for cough and wheezing.   °Cardiovascular:  Positive for chest pain and palpitations. Negative for orthopnea, leg swelling, PND and near-syncope.  °Gastrointestinal:  Negative for abdominal pain, blood in stool, constipation, diarrhea and nausea.  °Endocrine: Negative for polydipsia.  °Genitourinary:  Negative for dysuria, frequency, hematuria and urgency.  °Musculoskeletal:  Negative for back pain, myalgias and neck pain.  °Skin:  Negative for rash.  °Allergic/Immunologic: Negative for environmental allergies.  °Neurological:  Negative for dizziness and headaches.  °Hematological:  Does not bruise/bleed easily.  °Psychiatric/Behavioral:  Negative for suicidal ideas. The patient is not nervous/anxious.   ° °Patient Active Problem List  ° Diagnosis Date Noted  ° Diabetes mellitus screening 12/04/2015  ° ° °Allergies  °Allergen Reactions  ° Mucinex [Guaifenesin Er]   ° ° °Past Surgical History:  °Procedure Laterality Date  ° PILONIDAL CYST EXCISION    °   SHOULDER ARTHROSCOPY Left     Social History   Tobacco Use   Smoking status: Never   Smokeless tobacco: Former    Types: Snuff  Vaping Use   Vaping Use: Never used  Substance Use Topics   Alcohol use: No    Alcohol/week: 0.0 standard drinks   Drug use: No      Medication list has been reviewed and updated.  No outpatient medications have been marked as taking for the 06/16/21 encounter (Office Visit) with Juline Patch, MD.    [facility] 2/9 Scores 06/16/2021 10/19/2020 06/02/2020 02/06/2019  PHQ - 2 Score 1 0 0 0  PHQ- 9 Score 5 0 0 0    GAD 7 : Generalized Anxiety Score 06/16/2021 10/19/2020 06/02/2020  Nervous, Anxious, on Edge 0 0 0  Control/stop worrying 1 0 0  Worry too much - different things 1 0 0  Trouble relaxing 3 0 0  Restless 1 0 0  Easily annoyed or irritable 3 0 0  Afraid - awful might happen 0 0 0  Total GAD 7 Score 9 0 0  Anxiety Difficulty Very difficult - -    BP Readings from Last 3 Encounters:  06/16/21 128/82  10/15/20 108/75  06/02/20 110/62    Physical Exam HENT:     Right Ear: Tympanic membrane, ear canal and external ear normal. There is no impacted cerumen.     Left Ear: Tympanic membrane, ear canal and external ear normal. There is no impacted cerumen.     Nose: Nose normal.  Cardiovascular:     Heart sounds: S1 normal and S2 normal. No murmur heard. No systolic murmur is present.  No diastolic murmur is present.    No gallop. No S3 or S4 sounds.  Pulmonary:     Breath sounds: No wheezing or rhonchi.  Chest:     Chest wall: Deformity present.     Comments: Pectus excavatum Neurological:     Mental Status: He is alert.    Wt Readings from Last 3 Encounters:  06/16/21 214 lb (97.1 kg)  06/02/20 204 lb (92.5 kg)  05/20/19 203 lb (92.1 kg)    BP 128/82    Pulse 72    Ht 5' 11" (1.803 m)    Wt 214 lb (97.1 kg)    BMI 29.85 kg/m   Assessment and Plan:  1. Palpitation New onset.  Began approximately 2 weeks ago.  Patient is an avid runner with no previous medical concerns other than had COVID in the latter part of July of this year.  Sudden onset of palpitations that would last for 10 to 12 minutes, shortness of breath, and chest tightness.  Bradycardia rate 57 left axis deviation intervals  within normal limits LVH criteria met with RN as combined in 1 and 2 extremity leads.  Other than a delay in R wave progression which may be due to voltage criteria there is no Q waves and no ST-T wave changes.  Given the history I have concerns for possible cardiomyopathy with COVID being a concern.  We will refer to cardiology to be evaluated this week.  EKG and information will be sent. - EKG 12-Lead - TSH - CBC with Differential/Platelet - Renal Function Panel - DG Chest 2 View; Future  2. Chest pain, unspecified type As noted above - DG Chest 2 View; Future  3. Dyspnea, unspecified type As noted above and we will do a BNP and a D-dimer CBC renal panel with chest x-ray -  Brain natriuretic peptide; Future °- D-Dimer, Quantitative °- CBC with Differential/Platelet °- Renal Function Panel °- DG Chest 2 View; Future  ° ° °

## 2021-06-16 NOTE — Addendum Note (Signed)
Addended by: Duanne Limerick on: 06/16/2021 12:09 PM   Modules accepted: Orders

## 2021-06-17 DIAGNOSIS — E782 Mixed hyperlipidemia: Secondary | ICD-10-CM | POA: Insufficient documentation

## 2021-06-17 LAB — CBC WITH DIFFERENTIAL/PLATELET
Basophils Absolute: 0 10*3/uL (ref 0.0–0.2)
Basos: 1 %
EOS (ABSOLUTE): 0 10*3/uL (ref 0.0–0.4)
Eos: 1 %
Hematocrit: 42.4 % (ref 37.5–51.0)
Hemoglobin: 14.8 g/dL (ref 13.0–17.7)
Immature Grans (Abs): 0 10*3/uL (ref 0.0–0.1)
Immature Granulocytes: 0 %
Lymphocytes Absolute: 1.8 10*3/uL (ref 0.7–3.1)
Lymphs: 31 %
MCH: 31.4 pg (ref 26.6–33.0)
MCHC: 34.9 g/dL (ref 31.5–35.7)
MCV: 90 fL (ref 79–97)
Monocytes Absolute: 0.4 10*3/uL (ref 0.1–0.9)
Monocytes: 7 %
Neutrophils Absolute: 3.4 10*3/uL (ref 1.4–7.0)
Neutrophils: 60 %
Platelets: 187 10*3/uL (ref 150–450)
RBC: 4.72 x10E6/uL (ref 4.14–5.80)
RDW: 11.5 % — ABNORMAL LOW (ref 11.6–15.4)
WBC: 5.7 10*3/uL (ref 3.4–10.8)

## 2021-06-17 LAB — TSH: TSH: 1.63 u[IU]/mL (ref 0.450–4.500)

## 2021-06-17 LAB — RENAL FUNCTION PANEL
Albumin: 4.9 g/dL (ref 4.0–5.0)
BUN/Creatinine Ratio: 20 (ref 9–20)
BUN: 25 mg/dL — ABNORMAL HIGH (ref 6–24)
CO2: 26 mmol/L (ref 20–29)
Calcium: 9.8 mg/dL (ref 8.7–10.2)
Chloride: 100 mmol/L (ref 96–106)
Creatinine, Ser: 1.24 mg/dL (ref 0.76–1.27)
Glucose: 81 mg/dL (ref 70–99)
Phosphorus: 3.2 mg/dL (ref 2.8–4.1)
Potassium: 4.8 mmol/L (ref 3.5–5.2)
Sodium: 140 mmol/L (ref 134–144)
eGFR: 75 mL/min/{1.73_m2} (ref >59)

## 2021-06-17 LAB — D-DIMER, QUANTITATIVE: D-DIMER: <0.2 mg/L FEU (ref 0.00–0.49)

## 2021-10-25 ENCOUNTER — Ambulatory Visit (INDEPENDENT_AMBULATORY_CARE_PROVIDER_SITE_OTHER): Payer: BC Managed Care – PPO | Admitting: Family Medicine

## 2021-10-25 VITALS — BP 130/80 | HR 60 | Ht 71.0 in | Wt 216.0 lb

## 2021-10-25 DIAGNOSIS — M7741 Metatarsalgia, right foot: Secondary | ICD-10-CM

## 2021-10-25 MED ORDER — MELOXICAM 15 MG PO TABS: 15.0000 mg | ORAL_TABLET | Freq: Every day | ORAL | 0 refills | Status: DC

## 2021-10-25 NOTE — Patient Instructions (Signed)
Avulsion Fracture of the Foot  An avulsion fracture of the foot is a tearing away of a piece of bone in the foot. Bones are connected to other bones with strong bands of tissue (ligaments). Muscles are also connected to bones with strong bands of tissue (tendons). Avulsion fractures occur when severe stress on a ligament or tendon causes a small piece of bone to be pulled away from the main portion of the bone. This is typically caused by trauma or injury. Athletes may develop an avulsion fracture of the foot gradually (chronic avulsion fracture). Common locations of avulsion fracture of the foot are the heel bone and the long bone that connects to the fifth toe (fifth metatarsal bone). What are the causes? This condition may be caused by: A sudden or repetitive twisting or rolling of your foot or ankle. A fall. What increases the risk? You are more likely to develop this condition if: You participate in activities where twisting your ankle or foot are more likely to occur, such as: Dancing. Track and field. Walking on uneven surfaces. You have had diabetes for many years. You have weak bones (osteoporosis). You are male and older than age 70. What are the signs or symptoms? The main symptom of this condition is intense pain at the time of injury. You may have a feeling like a pop, a snap, or a tearing in your foot. Other signs and symptoms may include: Swelling in the heel area or outer part of the foot. Bruising in the heel, ankle, or outer part of the foot. The injured area feeling warm to the touch. Pain with movement or when you use your foot to support your body weight (bear weight). Pain when pressure is applied to the injured area. Difficulty walking. How is this diagnosed? An avulsion fracture is usually diagnosed with a physical exam and imaging tests, such as: X-ray. This will show if any bones are fractured or out of place (displaced). MRI. This will show your ligaments and  tendons. CT scan. This will show a more detailed image of your injury than an X-ray. How is this treated? Treatment for this condition depends on the size of the torn piece of bone and how far it has been displaced. Treatments for small avulsion fractures may include: Rest, ice, pressure (compression), and raising (elevating) your injured foot (RICE therapy). Preventing any movement (immobilization) of the injured foot for up to 6 weeks. This may be done by wearing a boot, a stiff-soled shoe, or a cast. Crutches may also be used to help bear weight until your foot heals. Treatment for large avulsion fractures may include: Surgery to reattach the bone to the ligament or the tendon. Crutches or a rolling scooter to help bear weight until your foot heals. Other treatments may be recommended, including: Physical therapy to help regain full use of your foot. This may last for several months. Medicines that reduce pain and swelling (NSAIDs). Follow these instructions at home: If you have a boot or a stiff-soled shoe: Wear the boot or shoe as told by your health care provider. Remove it only as told by your health care provider. Loosen it if your toes tingle, become numb, or turn cold and blue. Keep it clean. If the boot or shoe is not waterproof: Do not let it get wet. Cover it with a watertight covering when you take a bath or shower. If you have a cast: Do not put pressure on any part of the cast until it   is fully hardened. This may take several hours. Do not stick anything inside the cast to scratch your skin. Doing that increases your risk of infection. Check the skin around the cast every day. Tell your health care provider about any concerns. You may put lotion on dry skin around the edges of the cast. Do not put lotion on the skin underneath the cast. Keep it clean. If the cast is not waterproof: Do not let it get wet. Cover it with a watertight covering when you take a bath or  shower. Managing pain, stiffness, and swelling  If directed, put ice on the injured area. To do this: If you have a removable boot or stiff-soled shoe, remove it as told by your health care provider. Put ice in a plastic bag. Place a towel between your skin and the bag or between your cast and the bag. Leave the ice on for 20 minutes, 2-3 times a day. Remove the ice if your skin turns bright red. This is very important. If you cannot feel pain, heat, or cold, you have a greater risk of damage to the area. Move your toes often to reduce stiffness and swelling. Elevate the injured area above the level of your heart while you are sitting or lying down. Activity Return to your normal activities as told by your health care provider. Ask your health care provider what activities are safe for you. Use crutches as told by your health care provider. Ask your health care provider when it is safe to drive if you have a cast, boot, or stiff-soled shoe on your foot. General instructions Take over-the-counter and prescription medicines only as told by your health care provider. Do not use any products that contain nicotine or tobacco, such as cigarettes, e-cigarettes, and chewing tobacco. These can delay bone healing. If you need help quitting, ask your health care provider. Keep all follow-up visits. This is important. Contact a health care provider if: Your pain gets worse. You have chills or a fever. You have any of these problems with your cast: It is damaged. It has a bad odor or stains caused by fluids from your foot wound. It feels like your cast is too tight. Get help right away if: Your foot is cold, blue, or pale. You have pain, swelling, redness, or numbness under your cast. You have numbness or tingling in your foot. You have increased pain in your foot or have pain that is not relieved by pain medicine. You cannot move your toes or foot. Summary Avulsion fractures occur when severe  stress on a ligament or tendon causes a small piece of bone to be pulled away from the main portion of the bone. This condition may be caused by a fall or by a sudden or repetitive twisting or rolling of your foot or ankle. There are surgical and nonsurgical treatment options. You and your health care provider will discuss the options that are best for you. Keep all follow-up visits. This is important. This information is not intended to replace advice given to you by your health care provider. Make sure you discuss any questions you have with your health care provider. Document Revised: 09/24/2019 Document Reviewed: 09/24/2019 Elsevier Patient Education  2023 [employer]  

## 2021-10-25 NOTE — Progress Notes (Signed)
? ? ?Date:  10/25/2021  ? ?Name:  Thomas Kirby   DOB:  30-Nov-1980   MRN:  [id number] ? ? ?Chief Complaint: Foot Pain (Running in Feb- stepped on something sharp. Xray showed fx at base metatarsal. Pt has been wearing boot x 2 weeks- not better.) ? ?Foot Pain ?This is a new problem. The current episode started more than 1 month ago (8 weeks). The problem has been unchanged. Pertinent negatives include no abdominal pain, chest pain, chills, coughing, fever, headaches, myalgias, nausea, neck pain, rash or sore throat. He has tried rest and NSAIDs for the symptoms. The treatment provided no relief.  ? ?Lab Results  ?Component Value Date  ? NA 140 06/16/2021  ? K 4.8 06/16/2021  ? CO2 26 06/16/2021  ? GLUCOSE 81 06/16/2021  ? BUN 25 (H) 06/16/2021  ? CREATININE 1.24 06/16/2021  ? CALCIUM 9.8 06/16/2021  ? EGFR 75 06/16/2021  ? GFRNONAA 96 06/02/2020  ? ?Lab Results  ?Component Value Date  ? CHOL 166 06/02/2020  ? HDL 67 06/02/2020  ? [place] 91 06/02/2020  ? TRIG 33 06/02/2020  ? CHOLHDL 3.3 12/04/2015  ? ?Lab Results  ?Component Value Date  ? TSH 1.630 06/16/2021  ? ?No results found for: HGBA1C ?Lab Results  ?Component Value Date  ? WBC 5.7 06/16/2021  ? HGB 14.8 06/16/2021  ? HCT 42.4 06/16/2021  ? MCV 90 06/16/2021  ? PLT 187 06/16/2021  ? ?No results found for: ALT, AST, GGT, ALKPHOS, BILITOT ?No results found for: 25OHVITD2, [place], VD25OH  ? ?Review of Systems  ?Constitutional:  Negative for chills and fever.  ?HENT:  Negative for drooling, ear discharge, ear pain and sore throat.   ?Respiratory:  Negative for cough, shortness of breath and wheezing.   ?Cardiovascular:  Negative for chest pain, palpitations and leg swelling.  ?Gastrointestinal:  Negative for abdominal pain, blood in stool, constipation, diarrhea and nausea.  ?Endocrine: Negative for polydipsia.  ?Genitourinary:  Negative for dysuria, frequency, hematuria and urgency.  ?Musculoskeletal:  Negative for back pain, myalgias and neck pain.  ?Skin:   Negative for rash.  ?Allergic/Immunologic: Negative for environmental allergies.  ?Neurological:  Negative for dizziness and headaches.  ?Hematological:  Does not bruise/bleed easily.  ?Psychiatric/Behavioral:  Negative for suicidal ideas. The patient is not nervous/anxious.   ? ?Patient Active Problem List  ? Diagnosis Date Noted  ? Diabetes mellitus screening 12/04/2015  ? ? ?Allergies  ?Allergen Reactions  ? Mucinex [Guaifenesin Er]   ? ? ?Past Surgical History:  ?Procedure Laterality Date  ? PILONIDAL CYST EXCISION    ? SHOULDER ARTHROSCOPY Left   ? ? ?Social History  ? ?Tobacco Use  ? Smoking status: Never  ? Smokeless tobacco: Former  ?  Types: Snuff  ?Vaping Use  ? Vaping Use: Never used  ?Substance Use Topics  ? Alcohol use: No  ?  Alcohol/week: 0.0 standard drinks  ? Drug use: No  ? ? ? ?Medication list has been reviewed and updated. ? ?No outpatient medications have been marked as taking for the 10/25/21 encounter (Office Visit) with Juline Patch, MD.  ? ? ? ?  10/25/2021  ?  1:57 PM 06/16/2021  ?  9:45 AM 10/19/2020  ?  7:50 AM 06/02/2020  ?  9:47 AM  ?GAD 7 : Generalized Anxiety Score  ?Nervous, Anxious, on Edge 0 0 0 0  ?Control/stop worrying 0 1 0 0  ?Worry too much - different things 0 1 0 0  ?Trouble  relaxing 0 3 0 0  ?Restless 0 1 0 0  ?Easily annoyed or irritable 0 3 0 0  ?Afraid - awful might happen 0 0 0 0  ?Total GAD 7 Score 0 9 0 0  ?Anxiety Difficulty Not difficult at all Very difficult    ? ? ? ?  10/25/2021  ?  1:57 PM  ?Depression screen PHQ 2/9  ?Decreased Interest 0  ?Down, Depressed, Hopeless 0  ?PHQ - 2 Score 0  ?Altered sleeping 0  ?Tired, decreased energy 0  ?Change in appetite 0  ?Feeling bad or failure about yourself  0  ?Trouble concentrating 0  ?Moving slowly or fidgety/restless 0  ?Suicidal thoughts 0  ?PHQ-9 Score 0  ?Difficult doing work/chores Not difficult at all  ? ? ?BP Readings from Last 3 Encounters:  ?10/25/21 130/80  ?06/16/21 128/82  ?10/15/20 108/75  ? ? ?Physical  Exam ?HENT:  ?   Head: Normocephalic.  ?   Right Ear: Tympanic membrane and external ear normal.  ?   Left Ear: Tympanic membrane and external ear normal.  ?   Nose: Nose normal.  ?Eyes:  ?   General: No scleral icterus.    ?   Right eye: No discharge.     ?   Left eye: No discharge.  ?   Conjunctiva/sclera: Conjunctivae normal.  ?   Pupils: Pupils are equal, round, and reactive to light.  ?Neck:  ?   Thyroid: No thyromegaly.  ?   Vascular: No carotid bruit or JVD.  ?   Trachea: No tracheal deviation.  ?Cardiovascular:  ?   Rate and Rhythm: Normal rate and regular rhythm.  ?   Heart sounds: Normal heart sounds. No murmur heard. ?  No friction rub. No gallop.  ?Pulmonary:  ?   Effort: No respiratory distress.  ?   Breath sounds: Normal breath sounds. No stridor. No wheezing, rhonchi or rales.  ?Abdominal:  ?   General: Bowel sounds are normal.  ?   Palpations: Abdomen is soft. There is no mass.  ?   Tenderness: There is no abdominal tenderness. There is no guarding or rebound.  ?Musculoskeletal:     ?   General: Normal range of motion.  ?   Cervical back: Normal range of motion and neck supple.  ?   Right foot: Tenderness and bony tenderness present. No deformity.  ?   Comments: Base of 5th metatarsal  ?Feet:  ?   Right foot:  ?   Skin integrity: No erythema or fissure.  ?Lymphadenopathy:  ?   Cervical: No cervical adenopathy.  ?Skin: ?   General: Skin is warm.  ?   Findings: No rash.  ?Neurological:  ?   Mental Status: He is alert and oriented to person, place, and time.  ?   Cranial Nerves: No cranial nerve deficit.  ?   Deep Tendon Reflexes: Reflexes are normal and symmetric.  ? ? ?Wt Readings from Last 3 Encounters:  ?10/25/21 216 lb (98 kg)  ?06/16/21 214 lb (97.1 kg)  ?06/02/20 204 lb (92.5 kg)  ? ? ?BP 130/80   Pulse 60   Ht '5\' 11"'  (1.803 m)   Wt 216 lb (98 kg)   BMI 30.13 kg/m?  ? ?Assessment and Plan: ? ?1. Metatarsalgia of right foot ?New onset.  Persistent.  Unchanging in pain.  Patient has discomfort  of the proximal fifth metatarsal of the right foot.  I have concerns of an ulcer and fracture seen  that he may have stepped on something sharp in the morning and if it was an awkward pushoff and if he may have an avulsion of the proximal fifth metatarsal.  We will refer to podiatry for evaluation and we will hold on x-ray.  Indicated on x-ray as that its been about 4 weeks since the last evaluation.  (And patient is also run a mini triathlon since then).  In the meantime we will initiate meloxicam 15 mg once a day may have Tylenol but no other NSAIDs with this ?- Ambulatory referral to Podiatry ?- meloxicam (MOBIC) 15 MG tablet; Take 1 tablet (15 mg total) by mouth daily.  Dispense: 30 tablet; Refill: 0  ? ? ?

## 2022-02-18 ENCOUNTER — Encounter: Payer: Self-pay | Admitting: Family Medicine

## 2022-02-18 ENCOUNTER — Ambulatory Visit (INDEPENDENT_AMBULATORY_CARE_PROVIDER_SITE_OTHER): Payer: Self-pay | Admitting: Family Medicine

## 2022-02-18 VITALS — BP 120/80 | HR 48 | Temp 98.0°F | Ht 71.0 in | Wt 206.0 lb

## 2022-02-18 DIAGNOSIS — R509 Fever, unspecified: Secondary | ICD-10-CM

## 2022-02-18 DIAGNOSIS — J0100 Acute maxillary sinusitis, unspecified: Secondary | ICD-10-CM

## 2022-02-18 LAB — POC COVID19 BINAXNOW

## 2022-02-18 MED ORDER — DOXYCYCLINE HYCLATE 100 MG PO TABS: 100.0000 mg | ORAL_TABLET | Freq: Two times a day (BID) | ORAL | 0 refills | Status: DC

## 2022-02-18 NOTE — Progress Notes (Signed)
Date:  02/18/2022   Name:  Thomas Kirby   DOB:  [birth year]   MRN:  [id number]   Chief Complaint: Ear Pain (L) ear pain and head pain- had a fever last night- 101.5, no cough)  Sinusitis This is a new problem. The current episode started in the past 7 days. The problem has been gradually improving since onset. There has been no fever. The pain is mild. Associated symptoms include chills, congestion, diaphoresis, ear pain, headaches, neck pain and sneezing. Pertinent negatives include no coughing, shortness of breath, sinus pressure, sore throat or swollen glands. Past treatments include nothing.    Lab Results  Component Value Date   NA 140 06/16/2021   K 4.8 06/16/2021   CO2 26 06/16/2021   GLUCOSE 81 06/16/2021   BUN 25 (H) 06/16/2021   CREATININE 1.24 06/16/2021   CALCIUM 9.8 06/16/2021   EGFR 75 06/16/2021   GFRNONAA 96 06/02/2020   Lab Results  Component Value Date   CHOL 166 06/02/2020   HDL 67 06/02/2020   LDLCALC 91 06/02/2020   TRIG 33 06/02/2020   CHOLHDL 3.3 12/04/2015   Lab Results  Component Value Date   TSH 1.630 06/16/2021   No results found for: "HGBA1C" Lab Results  Component Value Date   WBC 5.7 06/16/2021   HGB 14.8 06/16/2021   HCT 42.4 06/16/2021   MCV 90 06/16/2021   PLT 187 06/16/2021   No results found for: "ALT", "AST", "GGT", "ALKPHOS", "BILITOT" No results found for: "25OHVITD2", "25OHVITD3", "VD25OH"   Review of Systems  Constitutional:  Positive for chills and diaphoresis.  HENT:  Positive for congestion, ear pain, postnasal drip, rhinorrhea and sneezing. Negative for sinus pressure, sinus pain and sore throat.   Respiratory:  Negative for cough and shortness of breath.   Musculoskeletal:  Positive for neck pain.  Neurological:  Positive for headaches.    Patient Active Problem List   Diagnosis Date Noted   Diabetes mellitus screening 12/04/2015    Allergies  Allergen Reactions   Mucinex [Guaifenesin Er]     Past  Surgical History:  Procedure Laterality Date   PILONIDAL CYST EXCISION     SHOULDER ARTHROSCOPY Left     Social History   Tobacco Use   Smoking status: Never   Smokeless tobacco: Former    Types: Snuff  Vaping Use   Vaping Use: Never used  Substance Use Topics   Alcohol use: No    Alcohol/week: 0.0 standard drinks of alcohol   Drug use: No     Medication list has been reviewed and updated.  Current Meds  Medication Sig   [DISCONTINUED] meloxicam (MOBIC) 15 MG tablet Take 1 tablet (15 mg total) by mouth daily.       02/18/2022    8:28 AM 10/25/2021    1:57 PM 06/16/2021    9:45 AM 10/19/2020    7:50 AM  GAD 7 : Generalized Anxiety Score  Nervous, Anxious, on Edge 0 0 0 0  Control/stop worrying 0 0 1 0  Worry too much - different things 0 0 1 0  Trouble relaxing 0 0 3 0  Restless 0 0 1 0  Easily annoyed or irritable 0 0 3 0  Afraid - awful might happen 0 0 0 0  Total GAD 7 Score 0 0 9 0  Anxiety Difficulty Not difficult at all Not difficult at all Very difficult        02/18/2022    8:28 AM  10/25/2021    1:57 PM 06/16/2021    9:42 AM  Depression screen PHQ 2/9  Decreased Interest 0 0 0  Down, Depressed, Hopeless 0 0 1  PHQ - 2 Score 0 0 1  Altered sleeping 0 0 0  Tired, decreased energy 1 0 1  Change in appetite 0 0 0  Feeling bad or failure about yourself  0 0 1  Trouble concentrating 0 0 0  Moving slowly or fidgety/restless 0 0 1  Suicidal thoughts 0 0 1  PHQ-9 Score 1 0 5  Difficult doing work/chores Not difficult at all Not difficult at all Somewhat difficult    BP Readings from Last 3 Encounters:  02/18/22 120/80  10/25/21 130/80  06/16/21 128/82    Physical Exam Vitals and nursing note reviewed.  HENT:     Head: Normocephalic.     Right Ear: Tympanic membrane and external ear normal.     Left Ear: External ear normal. There is impacted cerumen.     Nose: Nose normal.     Right Sinus: No maxillary sinus tenderness or frontal sinus tenderness.      Left Sinus: No maxillary sinus tenderness or frontal sinus tenderness.     Mouth/Throat:     Pharynx: Posterior oropharyngeal erythema present. No oropharyngeal exudate.  Eyes:     General: No scleral icterus.       Right eye: No discharge.        Left eye: No discharge.     Conjunctiva/sclera: Conjunctivae normal.     Pupils: Pupils are equal, round, and reactive to light.  Neck:     Thyroid: No thyromegaly.     Vascular: No JVD.     Trachea: No tracheal deviation.  Cardiovascular:     Rate and Rhythm: Normal rate and regular rhythm.     Heart sounds: Normal heart sounds. No murmur heard.    No friction rub. No gallop.  Pulmonary:     Effort: No respiratory distress.     Breath sounds: Normal breath sounds. No decreased breath sounds, wheezing, rhonchi or rales.  Abdominal:     General: Bowel sounds are normal.     Palpations: Abdomen is soft. There is no mass.     Tenderness: There is no abdominal tenderness. There is no guarding or rebound.  Musculoskeletal:        General: No tenderness. Normal range of motion.     Cervical back: Normal range of motion and neck supple.  Lymphadenopathy:     Head:     Right side of head: Submandibular adenopathy present.     Left side of head: Submandibular adenopathy present.     Cervical: No cervical adenopathy.     Right cervical: No superficial, deep or posterior cervical adenopathy.    Left cervical: No superficial or posterior cervical adenopathy.     Comments: tender  Skin:    General: Skin is warm.     Findings: No rash.  Neurological:     Mental Status: He is alert and oriented to person, place, and time.     Cranial Nerves: No cranial nerve deficit.     Deep Tendon Reflexes: Reflexes are normal and symmetric.     Wt Readings from Last 3 Encounters:  02/18/22 206 lb (93.4 kg)  10/25/21 216 lb (98 kg)  06/16/21 214 lb (97.1 kg)    BP 120/80   Pulse (!) 48   Temp 98 F (36.7 C) (Oral)   Ht 5'  11" (1.803 m)   Wt 206  lb (93.4 kg)   SpO2 98%   BMI 28.73 kg/m   Assessment and Plan:  1. Acute non-recurrent maxillary sinusitis New onset.  Persistent.  Stable.  History and physical examination is consistent with maxillary sinusitis.  We will treat with doxycycline 100 mg twice a day for 10 days.  - doxycycline (VIBRA-TABS) 100 MG tablet; Take 1 tablet (100 mg total) by mouth 2 (two) times daily.  Dispense: 20 tablet; Refill: 0  2. Fever, unspecified fever cause Fever over the course of the early part of the week.  COVID tested negative. - POC COVID-19    Otilio Miu, MD

## 2023-01-19 ENCOUNTER — Encounter: Payer: Self-pay | Admitting: Family Medicine

## 2023-01-19 ENCOUNTER — Ambulatory Visit (INDEPENDENT_AMBULATORY_CARE_PROVIDER_SITE_OTHER): Payer: BC Managed Care – PPO | Admitting: Family Medicine

## 2023-01-19 VITALS — BP 118/76 | HR 68 | Ht 71.0 in | Wt 217.0 lb

## 2023-01-19 DIAGNOSIS — Z0000 Encounter for general adult medical examination without abnormal findings: Secondary | ICD-10-CM

## 2023-01-19 LAB — HEMOCCULT GUIAC POC 1CARD (OFFICE): Fecal Occult Blood, POC: NEGATIVE

## 2023-01-19 NOTE — Progress Notes (Signed)
Date:  01/19/2023   Name:  Thomas Kirby   DOB:  1981-02-05   MRN:  [id number]   Chief Complaint: Annual Exam (Upper back- "feels like a cyst")  Patient is a 42 year old male who presents for a comprehensive physical exam. The patient reports the following problems: sebacceous cyst. Health maintenance has been reviewed up to date.      Lab Results  Component Value Date   NA 140 06/16/2021   K 4.8 06/16/2021   CO2 26 06/16/2021   GLUCOSE 81 06/16/2021   BUN 25 (H) 06/16/2021   CREATININE 1.24 06/16/2021   CALCIUM 9.8 06/16/2021   EGFR 75 06/16/2021   GFRNONAA 96 06/02/2020   Lab Results  Component Value Date   CHOL 166 06/02/2020   HDL 67 06/02/2020   LDLCALC 91 06/02/2020   TRIG 33 06/02/2020   CHOLHDL 3.3 12/04/2015   Lab Results  Component Value Date   TSH 1.630 06/16/2021   No results found for: "HGBA1C" Lab Results  Component Value Date   WBC 5.7 06/16/2021   HGB 14.8 06/16/2021   HCT 42.4 06/16/2021   MCV 90 06/16/2021   PLT 187 06/16/2021   No results found for: "ALT", "AST", "GGT", "ALKPHOS", "BILITOT" No results found for: "25OHVITD2", "25OHVITD3", "VD25OH"   Review of Systems  Constitutional:  Negative for chills and fever.  HENT:  Negative for drooling, ear discharge, ear pain and sore throat.   Respiratory:  Negative for cough, shortness of breath and wheezing.   Cardiovascular:  Negative for chest pain, palpitations and leg swelling.  Gastrointestinal:  Negative for abdominal pain, blood in stool, constipation, diarrhea and nausea.  Endocrine: Negative for polydipsia.  Genitourinary:  Negative for dysuria, frequency, hematuria and urgency.  Musculoskeletal:  Negative for back pain, myalgias and neck pain.  Skin:  Negative for rash.  Allergic/Immunologic: Negative for environmental allergies.  Neurological:  Negative for dizziness and headaches.  Hematological:  Does not bruise/bleed easily.  Psychiatric/Behavioral:  Negative for suicidal  ideas. The patient is not nervous/anxious.   All other systems reviewed and are negative.   Patient Active Problem List   Diagnosis Date Noted   Diabetes mellitus screening 12/04/2015    Allergies  Allergen Reactions   Mucinex [Guaifenesin Er]     Past Surgical History:  Procedure Laterality Date   PILONIDAL CYST EXCISION     SHOULDER ARTHROSCOPY Left     Social History   Tobacco Use   Smoking status: Never   Smokeless tobacco: Former    Types: Snuff  Vaping Use   Vaping status: Never Used  Substance Use Topics   Alcohol use: No    Alcohol/week: 0.0 standard drinks of alcohol   Drug use: No     Medication list has been reviewed and updated.  No outpatient medications have been marked as taking for the 01/19/23 encounter (Office Visit) with Duanne Limerick, MD.       01/19/2023   10:02 AM 02/18/2022    8:28 AM 10/25/2021    1:57 PM 06/16/2021    9:45 AM  GAD 7 : Generalized Anxiety Score  Nervous, Anxious, on Edge 0 0 0 0  Control/stop worrying 0 0 0 1  Worry too much - different things 0 0 0 1  Trouble relaxing 0 0 0 3  Restless 0 0 0 1  Easily annoyed or irritable 0 0 0 3  Afraid - awful might happen 0 0 0 0  Total  GAD 7 Score 0 0 0 9  Anxiety Difficulty Not difficult at all Not difficult at all Not difficult at all Very difficult       01/19/2023   10:02 AM 02/18/2022    8:28 AM 10/25/2021    1:57 PM  Depression screen PHQ 2/9  Decreased Interest 0 0 0  Down, Depressed, Hopeless 0 0 0  PHQ - 2 Score 0 0 0  Altered sleeping 0 0 0  Tired, decreased energy 0 1 0  Change in appetite 0 0 0  Feeling bad or failure about yourself  0 0 0  Trouble concentrating 0 0 0  Moving slowly or fidgety/restless 0 0 0  Suicidal thoughts 0 0 0  PHQ-9 Score 0 1 0  Difficult doing work/chores Not difficult at all Not difficult at all Not difficult at all    BP Readings from Last 3 Encounters:  01/19/23 118/76  02/18/22 120/80  10/25/21 130/80    Physical Exam Vitals  and nursing note reviewed.  Constitutional:      Appearance: He is well-groomed and normal weight.  HENT:     Head: Normocephalic.     Right Ear: Tympanic membrane, ear canal and external ear normal.     Left Ear: Tympanic membrane, ear canal and external ear normal.     Nose: Nose normal. No congestion or rhinorrhea.     Mouth/Throat:     Lips: Pink.     Mouth: Mucous membranes are moist.     Dentition: Normal dentition.     Tongue: No lesions.     Palate: No mass.     Pharynx: Oropharynx is clear.  Eyes:     General: Lids are normal. Gaze aligned appropriately. No scleral icterus.       Right eye: No discharge.        Left eye: No discharge.     Extraocular Movements: Extraocular movements intact.     Conjunctiva/sclera: Conjunctivae normal.     Pupils: Pupils are equal, round, and reactive to light.  Neck:     Thyroid: No thyroid mass or thyromegaly.     Vascular: Normal carotid pulses. No carotid bruit, hepatojugular reflux or JVD.     Trachea: Trachea and phonation normal. No tracheal deviation.  Cardiovascular:     Rate and Rhythm: Normal rate and regular rhythm.     Pulses:          Carotid pulses are 2+ on the right side and 2+ on the left side.      Radial pulses are 2+ on the right side and 2+ on the left side.       Femoral pulses are 2+ on the right side and 2+ on the left side.      Popliteal pulses are 2+ on the right side and 2+ on the left side.       Dorsalis pedis pulses are 2+ on the right side and 2+ on the left side.       Posterior tibial pulses are 2+ on the right side and 2+ on the left side.     Heart sounds: Normal heart sounds, S1 normal and S2 normal. No murmur heard.    No systolic murmur is present.     No diastolic murmur is present.     No friction rub. No gallop. No S3 or S4 sounds.  Pulmonary:     Effort: No respiratory distress.     Breath sounds: Normal breath sounds. No  decreased breath sounds, wheezing, rhonchi or rales.  Chest:   Breasts:    Right: Normal.     Left: Normal.  Abdominal:     General: Bowel sounds are normal.     Palpations: Abdomen is soft. There is no hepatomegaly, splenomegaly or mass.     Tenderness: There is no abdominal tenderness. There is no guarding or rebound.  Genitourinary:    Penis: Normal and uncircumcised.      Testes: Normal.        Right: Mass not present.        Left: Mass not present.     Epididymis:     Right: Normal.     Left: Normal.     Prostate: Normal. Not enlarged, not tender and no nodules present.     Rectum: Normal. Guaiac result negative. No mass.  Musculoskeletal:        General: No tenderness. Normal range of motion.     Cervical back: Normal range of motion and neck supple.     Right lower leg: No edema.     Left lower leg: No edema.  Lymphadenopathy:     Cervical: No cervical adenopathy.     Right cervical: No superficial, deep or posterior cervical adenopathy.    Left cervical: No superficial, deep or posterior cervical adenopathy.  Skin:    General: Skin is warm.     Capillary Refill: Capillary refill takes less than 2 seconds.     Findings: No bruising, erythema or rash.  Neurological:     Mental Status: He is alert and oriented to person, place, and time.     Cranial Nerves: Cranial nerves 2-12 are intact. No cranial nerve deficit.     Sensory: Sensation is intact.     Motor: Motor function is intact.     Deep Tendon Reflexes: Reflexes are normal and symmetric.  Psychiatric:        Behavior: Behavior is cooperative.     Wt Readings from Last 3 Encounters:  01/19/23 217 lb (98.4 kg)  02/18/22 206 lb (93.4 kg)  10/25/21 216 lb (98 kg)    BP 118/76   Pulse 68   Ht 5\' 11"  (1.803 m)   Wt 217 lb (98.4 kg)   SpO2 98%   BMI 30.27 kg/m   Assessment and Plan:  1. Annual physical exam No subjective/objective concerns noted during history of present illness, review of past medical history and medications, review of systems and physical exam.   Will check CMP and lipid panel for current status.  Stool guaiac was noted to be negative. - Comprehensive Metabolic Panel (CMET) - Lipid Panel With LDL/HDL Ratio - POCT Occult Blood Stool    Elizabeth Sauer, MD

## 2023-01-31 ENCOUNTER — Ambulatory Visit: Payer: BC Managed Care – PPO | Admitting: Family Medicine

## 2023-01-31 ENCOUNTER — Encounter: Payer: Self-pay | Admitting: Family Medicine

## 2023-01-31 VITALS — BP 100/68 | HR 64 | Ht 71.0 in | Wt 220.0 lb

## 2023-01-31 DIAGNOSIS — H60331 Swimmer's ear, right ear: Secondary | ICD-10-CM | POA: Diagnosis not present

## 2023-01-31 DIAGNOSIS — H66011 Acute suppurative otitis media with spontaneous rupture of ear drum, right ear: Secondary | ICD-10-CM | POA: Diagnosis not present

## 2023-01-31 MED ORDER — AMOXICILLIN-POT CLAVULANATE 875-125 MG PO TABS: 1.0000 | ORAL_TABLET | Freq: Two times a day (BID) | ORAL | 0 refills | Status: DC

## 2023-01-31 MED ORDER — CIPROFLOXACIN-DEXAMETHASONE 0.3-0.1 % OT SUSP: 4.0000 [drp] | Freq: Two times a day (BID) | OTIC | 0 refills | Status: DC

## 2023-01-31 NOTE — Progress Notes (Signed)
Date:  01/31/2023   Name:  Thomas Kirby   DOB:  12/08/1980   MRN:  [id number]   Chief Complaint: Ear Drainage (X 2 days, right ear, got swimmers ears 4 weeks ago got better, yellow drainage )  Ear Drainage  Associated symptoms include ear discharge and hearing loss. Pertinent negatives include no coughing, rhinorrhea or sore throat.  Otalgia  There is pain in the right ear. This is a new problem. The problem has been waxing and waning. There has been no fever. The pain is moderate. Associated symptoms include ear discharge and hearing loss. Pertinent negatives include no coughing, rhinorrhea or sore throat. Treatments tried: ear drops otc.    Lab Results  Component Value Date   NA 143 01/19/2023   K 4.4 01/19/2023   CO2 25 01/19/2023   GLUCOSE 91 01/19/2023   BUN 26 (H) 01/19/2023   CREATININE 1.22 01/19/2023   CALCIUM 9.9 01/19/2023   EGFR 75 01/19/2023   GFRNONAA 96 06/02/2020   Lab Results  Component Value Date   CHOL 212 (H) 01/19/2023   HDL 75 01/19/2023   LDLCALC 122 (H) 01/19/2023   TRIG 86 01/19/2023   CHOLHDL 3.3 12/04/2015   Lab Results  Component Value Date   TSH 1.630 06/16/2021   No results found for: "HGBA1C" Lab Results  Component Value Date   WBC 5.7 06/16/2021   HGB 14.8 06/16/2021   HCT 42.4 06/16/2021   MCV 90 06/16/2021   PLT 187 06/16/2021   Lab Results  Component Value Date   ALT 31 01/19/2023   AST 32 01/19/2023   ALKPHOS 70 01/19/2023   BILITOT 0.7 01/19/2023   No results found for: "25OHVITD2", "25OHVITD3", "VD25OH"   Review of Systems  HENT:  Positive for ear discharge, ear pain and hearing loss. Negative for nosebleeds, postnasal drip, rhinorrhea, sinus pressure and sore throat.   Eyes:  Negative for visual disturbance.  Respiratory:  Negative for cough and wheezing.   Cardiovascular:  Negative for chest pain and palpitations.  Endocrine: Negative for polydipsia and polyuria.    Patient Active Problem List   Diagnosis  Date Noted   Diabetes mellitus screening 12/04/2015    Allergies  Allergen Reactions   Guaifenesin Other (See Comments)    Patient stated he feels like he is going to faint when taking this   Mucinex [Guaifenesin Er]     Past Surgical History:  Procedure Laterality Date   PILONIDAL CYST EXCISION     SHOULDER ARTHROSCOPY Left     Social History   Tobacco Use   Smoking status: Never   Smokeless tobacco: Former    Types: Snuff  Vaping Use   Vaping status: Never Used  Substance Use Topics   Alcohol use: No    Alcohol/week: 0.0 standard drinks of alcohol   Drug use: No     Medication list has been reviewed and updated.  No outpatient medications have been marked as taking for the 01/31/23 encounter (Office Visit) with Duanne Limerick, MD.       01/31/2023    8:41 AM 01/19/2023   10:02 AM 02/18/2022    8:28 AM 10/25/2021    1:57 PM  GAD 7 : Generalized Anxiety Score  Nervous, Anxious, on Edge 0 0 0 0  Control/stop worrying 0 0 0 0  Worry too much - different things 0 0 0 0  Trouble relaxing 0 0 0 0  Restless 0 0 0 0  Easily annoyed  or irritable 0 0 0 0  Afraid - awful might happen 0 0 0 0  Total GAD 7 Score 0 0 0 0  Anxiety Difficulty Not difficult at all Not difficult at all Not difficult at all Not difficult at all       01/31/2023    8:41 AM 01/19/2023   10:02 AM 02/18/2022    8:28 AM  Depression screen PHQ 2/9  Decreased Interest 0 0 0  Down, Depressed, Hopeless 0 0 0  PHQ - 2 Score 0 0 0  Altered sleeping 0 0 0  Tired, decreased energy 0 0 1  Change in appetite 0 0 0  Feeling bad or failure about yourself  0 0 0  Trouble concentrating 0 0 0  Moving slowly or fidgety/restless 0 0 0  Suicidal thoughts 0 0 0  PHQ-9 Score 0 0 1  Difficult doing work/chores Not difficult at all Not difficult at all Not difficult at all    BP Readings from Last 3 Encounters:  01/31/23 100/68  01/19/23 118/76  02/18/22 120/80    Physical Exam Vitals reviewed.  HENT:      Right Ear: Drainage and tenderness present. A middle ear effusion is present.     Left Ear: Tympanic membrane, ear canal and external ear normal.     Mouth/Throat:     Mouth: Mucous membranes are moist.  Cardiovascular:     Rate and Rhythm: Normal rate.     Heart sounds: No murmur heard.    No gallop.  Pulmonary:     Breath sounds: No wheezing, rhonchi or rales.     Wt Readings from Last 3 Encounters:  01/31/23 220 lb (99.8 kg)  01/19/23 217 lb (98.4 kg)  02/18/22 206 lb (93.4 kg)    BP 100/68   Pulse 64   Ht 5\' 11"  (1.803 m)   Wt 220 lb (99.8 kg)   SpO2 94%   BMI 30.68 kg/m   Assessment and Plan:  1. Non-recurrent acute suppurative otitis media of right ear with spontaneous rupture of tympanic membrane New onset.  Exam and history consistent with an acute suppurative otitis media.  There was spontaneous rupture of clear fluid and we will treat with Augmentin 875 mg twice a day for 10 days. - amoxicillin-clavulanate (AUGMENTIN) 875-125 MG tablet; Take 1 tablet by mouth 2 (two) times daily.  Dispense: 20 tablet; Refill: 0  2. Acute swimmer's ear of right side Patient has been swimming in a swimming pool there is been some debris that we removed 1 week irrigated and the canal is red with tenderness we will treat with Ciprodex 4 drops in canal twice a day. - ciprofloxacin-dexamethasone (CIPRODEX) OTIC suspension; Place 4 drops into the right ear 2 (two) times daily.  Dispense: 7.5 mL; Refill: 0    Elizabeth Sauer, MD

## 2023-02-02 ENCOUNTER — Encounter: Payer: Self-pay | Admitting: Family Medicine

## 2023-06-26 ENCOUNTER — Ambulatory Visit: Payer: Self-pay | Admitting: Family Medicine

## 2023-06-26 VITALS — BP 116/77 | HR 60 | Temp 97.8°F | Ht 71.0 in | Wt 224.0 lb

## 2023-06-26 DIAGNOSIS — J0100 Acute maxillary sinusitis, unspecified: Secondary | ICD-10-CM | POA: Diagnosis not present

## 2023-06-26 DIAGNOSIS — M7631 Iliotibial band syndrome, right leg: Secondary | ICD-10-CM | POA: Diagnosis not present

## 2023-06-26 MED ORDER — AMOXICILLIN-POT CLAVULANATE 875-125 MG PO TABS: 1.0000 | ORAL_TABLET | Freq: Two times a day (BID) | ORAL | 0 refills | Status: DC

## 2023-06-26 MED ORDER — ETODOLAC 500 MG PO TABS: 500.0000 mg | ORAL_TABLET | Freq: Two times a day (BID) | ORAL | 1 refills | Status: DC

## 2023-06-26 NOTE — Progress Notes (Signed)
 Date:  06/26/2023   Name:  Thomas Kirby   DOB:  June 23, 1980   MRN:  [id number]   Chief Complaint: Headache (Pt states he is having pain in his teeth and cheeks ( top of his teeth and up) pt states he started having symptoms for 6 or 7 days. He's been running a humidifier.  Pt took tylenol and ibuprofen on Saturday (2 days ago). He also took dayquil, and nyquil on Saturday as well. His pain number is a 4 he states its constant.) and Facial Pain  Sinusitis This is a new problem. The current episode started in the past 7 days. The problem has been gradually worsening since onset. There has been no fever. The pain is moderate. Associated symptoms include congestion, headaches, sinus pressure and a sore throat. Pertinent negatives include no chills, coughing, diaphoresis, ear pain, hoarse voice, neck pain, shortness of breath, sneezing or swollen glands. (Bloody nasal discharge) Past treatments include nothing. The treatment provided moderate relief.  Leg Pain  There was no injury mechanism. The pain is present in the right thigh. The quality of the pain is described as aching.    Lab Results  Component Value Date   NA 143 01/19/2023   K 4.4 01/19/2023   CO2 25 01/19/2023   GLUCOSE 91 01/19/2023   BUN 26 (H) 01/19/2023   CREATININE 1.22 01/19/2023   CALCIUM 9.9 01/19/2023   EGFR 75 01/19/2023   GFRNONAA 96 06/02/2020   Lab Results  Component Value Date   CHOL 212 (H) 01/19/2023   HDL 75 01/19/2023   LDLCALC 122 (H) 01/19/2023   TRIG 86 01/19/2023   CHOLHDL 3.3 12/04/2015   Lab Results  Component Value Date   TSH 1.630 06/16/2021   No results found for: HGBA1C Lab Results  Component Value Date   WBC 5.7 06/16/2021   HGB 14.8 06/16/2021   HCT 42.4 06/16/2021   MCV 90 06/16/2021   PLT 187 06/16/2021   Lab Results  Component Value Date   ALT 31 01/19/2023   AST 32 01/19/2023   ALKPHOS 70 01/19/2023   BILITOT 0.7 01/19/2023   No results found for: 25OHVITD2,  25OHVITD3, VD25OH   Review of Systems  Constitutional:  Negative for chills and diaphoresis.  HENT:  Positive for congestion, sinus pressure and sore throat. Negative for ear pain, hearing loss, hoarse voice, nosebleeds, rhinorrhea and sneezing.   Respiratory:  Negative for apnea, cough, choking, chest tightness, shortness of breath, wheezing and stridor.   Cardiovascular:  Negative for chest pain, palpitations and leg swelling.  Gastrointestinal:  Negative for abdominal distention, abdominal pain and anal bleeding.  Genitourinary:  Negative for hematuria and penile discharge.  Musculoskeletal:  Negative for neck pain.  Neurological:  Positive for headaches.    Patient Active Problem List   Diagnosis Date Noted   Diabetes mellitus screening 12/04/2015    Allergies  Allergen Reactions   Guaifenesin Other (See Comments)    Patient stated he feels like he is going to faint when taking this   Mucinex [Guaifenesin Er]     Past Surgical History:  Procedure Laterality Date   PILONIDAL CYST EXCISION     SHOULDER ARTHROSCOPY Left     Social History   Tobacco Use   Smoking status: Never   Smokeless tobacco: Former    Types: Snuff  Vaping Use   Vaping status: Never Used  Substance Use Topics   Alcohol use: No    Alcohol/week: 0.0 standard drinks  of alcohol   Drug use: No     Medication list has been reviewed and updated.  No outpatient medications have been marked as taking for the 06/26/23 encounter (Office Visit) with Joshua Cathryne BROCKS, MD.       06/26/2023    3:05 PM 01/31/2023    8:41 AM 01/19/2023   10:02 AM 02/18/2022    8:28 AM  GAD 7 : Generalized Anxiety Score  Nervous, Anxious, on Edge 0 0 0 0  Control/stop worrying 0 0 0 0  Worry too much - different things 1 0 0 0  Trouble relaxing 0 0 0 0  Restless 0 0 0 0  Easily annoyed or irritable 1 0 0 0  Afraid - awful might happen 0 0 0 0  Total GAD 7 Score 2 0 0 0  Anxiety Difficulty Not difficult at all Not  difficult at all Not difficult at all Not difficult at all       06/26/2023    3:04 PM 01/31/2023    8:41 AM 01/19/2023   10:02 AM  Depression screen PHQ 2/9  Decreased Interest 0 0 0  Down, Depressed, Hopeless 0 0 0  PHQ - 2 Score 0 0 0  Altered sleeping 0 0 0  Tired, decreased energy 1 0 0  Change in appetite 1 0 0  Feeling bad or failure about yourself  0 0 0  Trouble concentrating 0 0 0  Moving slowly or fidgety/restless 0 0 0  Suicidal thoughts 0 0 0  PHQ-9 Score 2 0 0  Difficult doing work/chores Somewhat difficult Not difficult at all Not difficult at all    BP Readings from Last 3 Encounters:  06/26/23 116/77  01/31/23 100/68  01/19/23 118/76    Physical Exam Vitals and nursing note reviewed.  HENT:     Head: Normocephalic.     Right Ear: External ear normal.     Left Ear: External ear normal.     Nose: Nose normal.     Mouth/Throat:     Mouth: Mucous membranes are moist.  Eyes:     General: No scleral icterus.       Right eye: No discharge.        Left eye: No discharge.     Extraocular Movements:     Right eye: Normal extraocular motion.     Left eye: Normal extraocular motion.     Conjunctiva/sclera: Conjunctivae normal.     Pupils: Pupils are equal, round, and reactive to light.  Neck:     Thyroid: No thyromegaly.     Vascular: No JVD.     Trachea: No tracheal deviation.  Cardiovascular:     Rate and Rhythm: Normal rate and regular rhythm.     Heart sounds: Normal heart sounds. No murmur heard.    No friction rub. No gallop.  Pulmonary:     Effort: No respiratory distress.     Breath sounds: Normal breath sounds. No wheezing, rhonchi or rales.  Abdominal:     General: Bowel sounds are normal.     Palpations: Abdomen is soft. There is no mass.     Tenderness: There is no abdominal tenderness. There is no guarding or rebound.  Musculoskeletal:        General: No swelling or tenderness. Normal range of motion.     Cervical back: Normal range of  motion and neck supple.  Lymphadenopathy:     Cervical: No cervical adenopathy.  Skin:  General: Skin is warm.     Findings: No rash.  Neurological:     Mental Status: He is alert and oriented to person, place, and time.     Cranial Nerves: No cranial nerve deficit.     Deep Tendon Reflexes: Reflexes are normal and symmetric.     Wt Readings from Last 3 Encounters:  06/26/23 224 lb (101.6 kg)  01/31/23 220 lb (99.8 kg)  01/19/23 217 lb (98.4 kg)    BP 116/77   Pulse 60   Temp 97.8 F (36.6 C) (Oral)   Ht 5' 11 (1.803 m)   Wt 224 lb (101.6 kg)   SpO2 97%   BMI 31.24 kg/m   Assessment and Plan: 1. Acute non-recurrent maxillary sinusitis (Primary) New onset.  Persistent.  Stable.  History and examination is consistent with sinus infection.  We will treat with Augmentin  875 mg twice a day for 10 days. - amoxicillin -clavulanate (AUGMENTIN ) 875-125 MG tablet; Take 1 tablet by mouth 2 (two) times daily.  Dispense: 20 tablet; Refill: 0  2. Iliotibial band syndrome of right side New onset.  Persistent.  Pain is along the iliotibial band.  Have discussed use of friction tape and we will proceed with use of etodolac  500 mg twice a day for 2 weeks if pain is not resolved patient is to call and we will refer to sports medicine. - etodolac  (LODINE ) 500 MG tablet; Take 1 tablet (500 mg total) by mouth 2 (two) times daily.  Dispense: 60 tablet; Refill: 1     Cathryne Molt, MD

## 2023-09-17 ENCOUNTER — Ambulatory Visit
Admission: RE | Admit: 2023-09-17 | Discharge: 2023-09-17 | Disposition: A | Discharge status: Home / Self Care | Payer: Self-pay | Source: Ambulatory Visit | Attending: Physician Assistant | Admitting: Physician Assistant

## 2023-09-17 VITALS — BP 112/73 | HR 69 | Temp 99.0°F | Resp 17

## 2023-09-17 DIAGNOSIS — U071 COVID-19: Secondary | ICD-10-CM | POA: Diagnosis present

## 2023-09-17 DIAGNOSIS — R509 Fever, unspecified: Secondary | ICD-10-CM | POA: Insufficient documentation

## 2023-09-17 DIAGNOSIS — R051 Acute cough: Secondary | ICD-10-CM | POA: Insufficient documentation

## 2023-09-17 LAB — GROUP A STREP BY PCR: Group A Strep by PCR: NOT DETECTED

## 2023-09-17 LAB — RESP PANEL BY RT-PCR (FLU A&B, COVID) ARPGX2
Influenza A by PCR: NEGATIVE
Influenza B by PCR: NEGATIVE
SARS Coronavirus 2 by RT PCR: POSITIVE — AB

## 2023-09-17 NOTE — ED Triage Notes (Addendum)
 Sx x 1.5 days  Headache Sore throat, somewhat better today Fever yesterday Posy nasal drip.  fatigue

## 2023-09-17 NOTE — ED Provider Notes (Signed)
 [facility]    CSN: [id number] Arrival date & time: 09/17/23  1143      History   Chief Complaint Chief Complaint  Patient presents with   Fever    Congestion, headache, fever, and fatigue - Entered by patient   Cough   Sore Throat   Headache   Fatigue    HPI Thomas Kirby is a 43 y.o. male presenting for fever, fatigue, cough, congestion, sore throat and headaches and body aches x 2 days.  Denies ear pain, sinus pain, chest pain, wheezing, shortness of breath, abdominal pain, vomiting or diarrhea.  Patient has been taking over-the-counter meds. 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  HPI  Past Medical History:  Diagnosis Date   Allergy     Patient Active Problem List   Diagnosis Date Noted   Diabetes mellitus screening 12/04/2015    Past Surgical History:  Procedure Laterality Date   PILONIDAL CYST EXCISION     SHOULDER ARTHROSCOPY Left        Home Medications    Prior to Admission medications   Medication Sig Start Date End Date Taking? Authorizing Provider  amoxicillin-clavulanate (AUGMENTIN) 875-125 MG tablet Take 1 tablet by mouth 2 (two) times daily. 06/26/23   Duanne Limerick, MD  etodolac (LODINE) 500 MG tablet Take 1 tablet (500 mg total) by mouth 2 (two) times daily. 06/26/23   Duanne Limerick, MD    Family History Family History  Problem Relation Age of Onset   Cancer Maternal Grandfather    Diverticulitis Father    Kidney cancer Neg Hx    Kidney disease Neg Hx    Prostate cancer Neg Hx     Social History Social History   Tobacco Use   Smoking status: Never   Smokeless tobacco: Former    Types: Snuff  Vaping Use   Vaping status: Never Used  Substance Use Topics   Alcohol use: No    Alcohol/week: 0.0 standard drinks of alcohol   Drug use: No     Allergies   Guaifenesin and Mucinex [guaifenesin er]   Review of Systems Review of Systems  Constitutional:  Positive for fatigue and fever.  HENT:  Positive for  congestion, postnasal drip, rhinorrhea and sore throat. Negative for sinus pressure and sinus pain.   Respiratory:  Positive for cough. Negative for shortness of breath and wheezing.   Cardiovascular:  Negative for chest pain.  Gastrointestinal:  Negative for abdominal pain, diarrhea, nausea and vomiting.  Musculoskeletal:  Positive for myalgias.  Neurological:  Positive for headaches. Negative for weakness and light-headedness.  Hematological:  Negative for adenopathy.     Physical Exam Triage Vital Signs ED Triage Vitals  Encounter Vitals Group     BP      Systolic BP Percentile      Diastolic BP Percentile      Pulse      Resp      Temp      Temp src      SpO2      Weight      Height      Head Circumference      Peak Flow      Pain Score      Pain Loc      Pain Education      Exclude from Growth Chart    No data found.  Updated Vital Signs BP 112/73 (BP Location: Left Arm)   Pulse 69   Temp 99  F (37.2 C) (Oral)   Resp 17   SpO2 96%      Physical Exam Vitals and nursing note reviewed.  Constitutional:      General: He is not in acute distress.    Appearance: Normal appearance. He is well-developed. He is not ill-appearing.  HENT:     Head: Normocephalic and atraumatic.     Nose: Congestion present.     Mouth/Throat:     Mouth: Mucous membranes are moist.     Pharynx: Oropharynx is clear. Posterior oropharyngeal erythema present.  Eyes:     General: No scleral icterus.    Conjunctiva/sclera: Conjunctivae normal.  Cardiovascular:     Rate and Rhythm: Normal rate and regular rhythm.     Heart sounds: Normal heart sounds.  Pulmonary:     Effort: Pulmonary effort is normal. No respiratory distress.     Breath sounds: Normal breath sounds.  Musculoskeletal:     Cervical back: Neck supple.  Skin:    General: Skin is warm and dry.     Capillary Refill: Capillary refill takes less than 2 seconds.  Neurological:     General: No focal deficit present.      Mental Status: He is alert. Mental status is at baseline.     Motor: No weakness.     Gait: Gait normal.  Psychiatric:        Mood and Affect: Mood normal.        Behavior: Behavior normal.      UC Treatments / Results  Labs (all labs ordered are listed, but only abnormal results are displayed) Labs Reviewed  RESP PANEL BY RT-PCR (FLU A&B, COVID) ARPGX2 - Abnormal; Notable for the following components:      Result Value   SARS Coronavirus 2 by RT PCR POSITIVE (*)    All other components within normal limits  GROUP A STREP BY PCR    EKG   Radiology No results found.  Procedures Procedures (including critical care time)  Medications Ordered in UC Medications - No data to display  Initial Impression / Assessment and Plan / UC Course  I have reviewed the triage vital signs and the nursing notes.  Pertinent labs & imaging results that were available during my care of the patient were reviewed by me and considered in my medical decision making (see chart for details).   43 year old male presents for fever, fatigue, cough, congestion, and sore throat as well as body aches and headaches for the past couple days.  Vitals are stable and normal.  Overall well-appearing.  On exam has nasal congestion and erythema posterior pains.  Chest clear.  Respiratory panel and strep testing obtained.  Positive COVID.  Reviewed result with patient.  Reviewed current CDC guidelines, isolation protocol and ED precautions.  Offered prescription cough medication but patient declines and states he will take OTC meds.  Reviewed use of Dayquill/NyQuil and Motrin.   Patient declines AVS.   Final Clinical Impressions(s) / UC Diagnoses   Final diagnoses:  COVID-19  Acute cough  Fever, unspecified   Discharge Instructions   None    ED Prescriptions   None    PDMP not reviewed this encounter.   Shirlee Latch, PA-C 09/17/23 1256

## 2023-09-18 ENCOUNTER — Encounter: Payer: Self-pay | Admitting: Family Medicine

## 2024-04-15 ENCOUNTER — Encounter: Payer: Self-pay | Admitting: Family Medicine

## 2024-04-15 ENCOUNTER — Ambulatory Visit: Admitting: Family Medicine

## 2024-04-15 VITALS — BP 114/72 | HR 58 | Temp 98.1°F | Ht 71.0 in | Wt 209.0 lb

## 2024-04-15 DIAGNOSIS — Z0000 Encounter for general adult medical examination without abnormal findings: Secondary | ICD-10-CM | POA: Diagnosis not present

## 2024-04-15 DIAGNOSIS — Z8739 Personal history of other diseases of the musculoskeletal system and connective tissue: Secondary | ICD-10-CM | POA: Diagnosis not present

## 2024-04-15 DIAGNOSIS — Z6829 Body mass index (BMI) 29.0-29.9, adult: Secondary | ICD-10-CM

## 2024-04-15 DIAGNOSIS — Z13220 Encounter for screening for lipoid disorders: Secondary | ICD-10-CM

## 2024-04-15 DIAGNOSIS — Z136 Encounter for screening for cardiovascular disorders: Secondary | ICD-10-CM

## 2024-04-15 DIAGNOSIS — Z125 Encounter for screening for malignant neoplasm of prostate: Secondary | ICD-10-CM

## 2024-04-15 DIAGNOSIS — Z131 Encounter for screening for diabetes mellitus: Secondary | ICD-10-CM | POA: Diagnosis not present

## 2024-04-15 NOTE — Progress Notes (Signed)
 Complete physical exam  Patient: Thomas Kirby   DOB: [birth year]   43 y.o. Male  MRN: [id number]  Subjective:    Chief Complaint  Patient presents with   Annual Exam    Thomas Kirby is a 43 y.o. male who presents today for a complete physical exam. He reports consuming a general diet. Gym/ health club routine includes low impact aerobics and swimming. He generally feels fairly well. He reports sleeping fairly well. He does not have additional problems to discuss today.  Overall healthy, No active clinical concerns or health conditions.  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 Loves exercising. Had hip issues so he switched to low impact physical activity, since then he no longer has hip pain.   Most recent fall risk assessment:    06/26/2023    3:04 PM  Fall Risk   Falls in the past year? 0  Number falls in past yr: 0  Injury with Fall? 0  Risk for fall due to : No Fall Risks  Follow up Falls evaluation completed     Most recent depression screenings:    06/26/2023    3:04 PM 01/31/2023    8:41 AM  PHQ 2/9 Scores  PHQ - 2 Score 0 0  PHQ- 9 Score 2 0    Vision:this year and Dental: No current dental problems and Last dental visit: this year     Patient Care Team: Ailanie Ruttan K, MD as PCP - General (Family Medicine)   Outpatient Medications Prior to Visit  Medication Sig   [DISCONTINUED] amoxicillin -clavulanate (AUGMENTIN ) 875-125 MG tablet Take 1 tablet by mouth 2 (two) times daily.   [DISCONTINUED] etodolac  (LODINE ) 500 MG tablet Take 1 tablet (500 mg total) by mouth 2 (two) times daily. (Patient not taking: Reported on 04/15/2024)   No facility-administered medications prior to visit.    Review of Systems  All other systems reviewed and are negative.         Objective:     BP 114/72   Pulse (!) 58   Temp 98.1 F (36.7 C) (Oral)   Ht 5' 11 (1.803 m)   Wt 209 lb (94.8 kg)   SpO2 95%   BMI 29.15 kg/m  BP Readings from Last 3  Encounters:  04/15/24 114/72  09/17/23 112/73  06/26/23 116/77      Physical Exam Vitals and nursing note reviewed.  Constitutional:      Appearance: Normal appearance.  HENT:     Head: Normocephalic.     Right Ear: External ear normal.     Left Ear: External ear normal.  Eyes:     Conjunctiva/sclera: Conjunctivae normal.  Cardiovascular:     Rate and Rhythm: Normal rate.  Pulmonary:     Effort: Pulmonary effort is normal. No respiratory distress.  Abdominal:     Palpations: Abdomen is soft.  Musculoskeletal:        General: Normal range of motion.  Skin:    General: Skin is warm.  Neurological:     Mental Status: He is alert and oriented to person, place, and time.  Psychiatric:        Mood and Affect: Mood normal.      No results found for any visits on 04/15/24. Last lipids Lab Results  Component Value Date   CHOL 212 (H) 01/19/2023   HDL 75 01/19/2023   LDLCALC 122 (H) 01/19/2023   TRIG 86 01/19/2023   CHOLHDL 3.3 12/04/2015  Assessment & Plan:    Routine Health Maintenance and Physical Exam  Immunization History  Administered Date(s) Administered   Moderna Sars-Covid-2 Vaccination 08/22/2019, 09/19/2019, 05/28/2020   Tdap 02/06/2019    Health Maintenance  Topic Date Due   HIV Screening  Never done   Hepatitis C Screening  Never done   Hepatitis B Vaccines 19-59 Average Risk (1 of 3 - 19+ 3-dose series) Never done   HPV VACCINES (1 - 3-dose SCDM series) Never done   Influenza Vaccine  09/17/2024 (Originally 01/19/2024)   COVID-19 Vaccine (4 - 2025-26 season) 05/01/2025 (Originally 02/19/2024)   DTaP/Tdap/Td (2 - Td or Tdap) 02/05/2029   Pneumococcal Vaccine  Aged Out   Meningococcal B Vaccine  Aged Out    Discussed health benefits of physical activity, and encouraged him to engage in regular exercise appropriate for his age and condition.  Problem List Items Addressed This Visit   None Visit Diagnoses       Annual physical exam    -   Primary   Relevant Orders   CBC with Differential/Platelet   Comprehensive metabolic panel with GFR   TSH   PSA     History of rotator cuff tear         Encounter for lipid screening for cardiovascular disease       Relevant Orders   Lipid panel     Diabetes mellitus screening       Relevant Orders   Hemoglobin A1c     Screening for prostate cancer       Relevant Orders   PSA     BMI 29.0-29.9,adult          Return in about 1 year (around 04/15/2025) for Annual.     Vinary K Curley Fayette, MD

## 2024-04-16 ENCOUNTER — Ambulatory Visit: Payer: Self-pay | Admitting: Family Medicine

## 2024-04-16 LAB — CBC WITH DIFFERENTIAL/PLATELET
Basophils Absolute: 0 x10E3/uL (ref 0.0–0.2)
Basos: 1 %
EOS (ABSOLUTE): 0.1 x10E3/uL (ref 0.0–0.4)
Eos: 2 %
Hematocrit: 46.4 % (ref 37.5–51.0)
Hemoglobin: 15.4 g/dL (ref 13.0–17.7)
Immature Grans (Abs): 0 x10E3/uL (ref 0.0–0.1)
Immature Granulocytes: 0 %
Lymphocytes Absolute: 2 x10E3/uL (ref 0.7–3.1)
Lymphs: 41 %
MCH: 31.2 pg (ref 26.6–33.0)
MCHC: 33.2 g/dL (ref 31.5–35.7)
MCV: 94 fL (ref 79–97)
Monocytes Absolute: 0.4 x10E3/uL (ref 0.1–0.9)
Monocytes: 8 %
Neutrophils Absolute: 2.3 x10E3/uL (ref 1.4–7.0)
Neutrophils: 48 %
Platelets: 190 x10E3/uL (ref 150–450)
RBC: 4.93 x10E6/uL (ref 4.14–5.80)
RDW: 11.9 % (ref 11.6–15.4)
WBC: 4.8 x10E3/uL (ref 3.4–10.8)

## 2024-04-16 LAB — COMPREHENSIVE METABOLIC PANEL WITH GFR
ALT: 22 IU/L (ref 0–44)
AST: 22 IU/L (ref 0–40)
Albumin: 4.4 g/dL (ref 4.1–5.1)
Alkaline Phosphatase: 60 IU/L (ref 47–123)
BUN/Creatinine Ratio: 17 (ref 9–20)
BUN: 17 mg/dL (ref 6–24)
Bilirubin Total: 0.5 mg/dL (ref 0.0–1.2)
CO2: 24 mmol/L (ref 20–29)
Calcium: 9.9 mg/dL (ref 8.7–10.2)
Chloride: 102 mmol/L (ref 96–106)
Creatinine, Ser: 1.02 mg/dL (ref 0.76–1.27)
Globulin, Total: 2.9 g/dL (ref 1.5–4.5)
Glucose: 96 mg/dL (ref 70–99)
Potassium: 4.9 mmol/L (ref 3.5–5.2)
Sodium: 139 mmol/L (ref 134–144)
Total Protein: 7.3 g/dL (ref 6.0–8.5)
eGFR: 93 mL/min/1.73 (ref >59)

## 2024-04-16 LAB — LIPID PANEL
Chol/HDL Ratio: 3 ratio (ref 0.0–5.0)
Cholesterol, Total: 206 mg/dL — ABNORMAL HIGH (ref 100–199)
HDL: 69 mg/dL (ref >39)
LDL Chol Calc (NIH): 126 mg/dL — ABNORMAL HIGH (ref 0–99)
Triglycerides: 61 mg/dL (ref 0–149)
VLDL Cholesterol Cal: 11 mg/dL (ref 5–40)

## 2024-04-16 LAB — PSA: Prostate Specific Ag, Serum: 0.4 ng/mL (ref 0.0–4.0)

## 2024-04-16 LAB — TSH: TSH: 1.58 u[IU]/mL (ref 0.450–4.500)

## 2024-04-16 LAB — HEMOGLOBIN A1C
Est. average glucose Bld gHb Est-mCnc: 105 mg/dL
Hgb A1c MFr Bld: 5.3 % (ref 4.8–5.6)

## 2024-06-17 ENCOUNTER — Encounter: Payer: Self-pay | Admitting: Family Medicine

## 2024-06-18 ENCOUNTER — Ambulatory Visit
Admission: EM | Admit: 2024-06-18 | Discharge: 2024-06-18 | Disposition: A | Discharge status: Home / Self Care | Attending: Emergency Medicine | Admitting: Emergency Medicine

## 2024-06-18 DIAGNOSIS — L723 Sebaceous cyst: Secondary | ICD-10-CM

## 2024-06-18 DIAGNOSIS — L089 Local infection of the skin and subcutaneous tissue, unspecified: Secondary | ICD-10-CM | POA: Diagnosis not present

## 2024-06-18 MED ORDER — DOXYCYCLINE HYCLATE 100 MG PO CAPS: 100.0000 mg | ORAL_CAPSULE | Freq: Two times a day (BID) | ORAL | 0 refills | Status: DC

## 2024-06-18 MED ORDER — IBUPROFEN 600 MG PO TABS: 600.0000 mg | ORAL_TABLET | Freq: Four times a day (QID) | ORAL | 0 refills | Status: AC | PRN

## 2024-06-18 NOTE — ED Triage Notes (Signed)
 Pt c/o cyst behind L shoulder x5 days. States tender,swollen & red. Denies any fevers or drainage. Has tried warm compresses w/o relief.

## 2024-06-18 NOTE — Discharge Instructions (Signed)
 Return here if not getting better in 2 or 3 days with conservative treatment, sooner if you get worse so that we can do an I&D.   Finish the doxycycline , even if you feel better.  Take 1000 mg of tylenol with the 600 mg motrin up to 3-4 times a day as needed for pain.  Go to www.goodrx.com  or www.costplusdrugs.com to look up your medications. This will give you a list of where you can find your prescriptions at the most affordable prices. Or ask the pharmacist what the cash price is, or if they have any other discount programs available to help make your medication more affordable. This can be less expensive than what you would pay with insurance.

## 2024-06-18 NOTE — ED Provider Notes (Signed)
 HPI  SUBJECTIVE:  Thomas Kirby is a 43 y.o. male who presents with painful tender hard mass of gradually increasing size in the area where he has had a sebaceous cyst for years over his left upper back.  Denies fevers, drainage, trauma to the area.  He has tried warm compresses without improvement in symptoms.  Symptoms worse with palpation.  No antipyretic in the past 6 hours.  He has a past medical history of hypercholesterolemia.  No history of MRSA.  PCP: [facility].  He has an appointment with them on 1/6.    Past Medical History:  Diagnosis Date   Allergy     Past Surgical History:  Procedure Laterality Date   PILONIDAL CYST EXCISION     SHOULDER ARTHROSCOPY Left     Family History  Problem Relation Age of Onset   Cancer Maternal Grandfather    Diverticulitis Father    Kidney cancer Neg Hx    Kidney disease Neg Hx    Prostate cancer Neg Hx     Social History[1]  Current Medications[2]  Allergies[3]   ROS  As noted in HPI.   Physical Exam  BP 111/74 (BP Location: Right Arm)   Pulse (!) 58   Temp 97.7 F (36.5 C) (Oral)   Resp 18   Wt 97.4 kg   SpO2 97%   BMI 29.96 kg/m   Constitutional: Well developed, well nourished, no acute distress Eyes:  EOMI, conjunctiva normal bilaterally HENT: Normocephalic, atraumatic,mucus membranes moist Respiratory: Normal inspiratory effort Cardiovascular: Normal rate GI: nondistended skin:  tender mass with surrounding 3 x 3 cm erythema no induration. Positive central fluctuance.  Musculoskeletal: no deformities Neurologic: Alert & oriented x 3, no focal neuro deficits Psychiatric: Speech and behavior appropriate   ED Course   Medications - No data to display  No orders of the defined types were placed in this encounter.   No results found for this or any previous visit (from the past 24 hours). No results found.  ED Clinical Impression  1. Infected sebaceous cyst      ED  Assessment/Plan    Patient consented to the use of clinical photography  Patient presents with an infected, inflamed sebaceous cyst.  I gave the patient the option of doing an I&D today versus trying conservative treatment with doxycycline  100 mg p.o. twice daily for 7 days, Tylenol combined with ibuprofen and warm compresses.  If he gets better, he has follow-up with his PCP on 1/6 for definitive treatment.  If it is not better in 2 to 3 days, he is to return here for an I&D, and to return here sooner if he gets worse.  Patient would like to try conservative treatment first.  Discussed  MDM, treatment plan, and plan for follow-up with patient.. patient agrees with plan.   Meds ordered this encounter  Medications   ibuprofen (ADVIL) 600 MG tablet    Sig: Take 1 tablet (600 mg total) by mouth every 6 (six) hours as needed.    Dispense:  30 tablet    Refill:  0   doxycycline  (VIBRAMYCIN ) 100 MG capsule    Sig: Take 1 capsule (100 mg total) by mouth 2 (two) times daily for 7 days.    Dispense:  14 capsule    Refill:  0      *This clinic note was created using Scientist, clinical (histocompatibility and immunogenetics). Therefore, there may be occasional mistakes despite careful proofreading.  ?     [1]  Social History Tobacco Use   Smoking status: Never   Smokeless tobacco: Former    Types: Snuff  Vaping Use   Vaping status: Never Used  Substance Use Topics   Alcohol use: No    Alcohol/week: 0.0 standard drinks of alcohol   Drug use: No  [2] No current facility-administered medications for this encounter.  Current Outpatient Medications:    doxycycline  (VIBRAMYCIN ) 100 MG capsule, Take 1 capsule (100 mg total) by mouth 2 (two) times daily for 7 days., Disp: 14 capsule, Rfl: 0   ibuprofen (ADVIL) 600 MG tablet, Take 1 tablet (600 mg total) by mouth every 6 (six) hours as needed., Disp: 30 tablet, Rfl: 0 [3]  Allergies Allergen Reactions   Guaifenesin Other (See Comments)    Patient stated he feels like  he is going to faint when taking this   Mucinex [Guaifenesin Er]      Dacia Capers, MD 06/18/24 [phone]

## 2024-06-25 ENCOUNTER — Encounter: Payer: Self-pay | Admitting: Family Medicine

## 2024-06-25 ENCOUNTER — Ambulatory Visit: Admitting: Family Medicine

## 2024-06-25 VITALS — BP 98/64 | HR 67 | Ht 71.0 in | Wt 212.0 lb

## 2024-06-25 DIAGNOSIS — L720 Epidermal cyst: Secondary | ICD-10-CM

## 2024-06-25 MED ORDER — DOXYCYCLINE HYCLATE 100 MG PO CAPS: 100.0000 mg | ORAL_CAPSULE | Freq: Two times a day (BID) | ORAL | 0 refills | Status: AC

## 2024-06-25 NOTE — Progress Notes (Signed)
 "  Acute Office Visit  Subjective:     Patient ID: Thomas Kirby, male    DOB: 1981-05-24, 44 y.o.   MRN: [id number]  Chief Complaint  Patient presents with   Cyst    Cyst on the back part of left shoulder. Cyst has been there for about 2 years. It has become tender to touch and swollen. He has taken Doxycyline but cyst is still seems infected.     HPI Patient is in today for  Discussed the use of AI scribe software for clinical note transcription with the patient, who gave verbal consent to proceed.  History of Present Illness Thomas Kirby is a 44 year old male who presents with a swollen and painful cyst on his left upper back.  He has had a cyst on his left upper back for at least two years, which was previously asymptomatic. Around December 25th, he began to experience discomfort in the area. By December 26th or 27th, the cyst became swollen and red, prompting a visit to urgent care.  At urgent care, he was prescribed doxycycline , which he took for seven days. Since then, the cyst has changed in appearance from being firm and red to purple and shriveled. He is unsure if it is improving or worsening but notes it is still not normal.  There has been no recent trauma or manipulation of the cyst. There has been no pus discharge, but he describes some 'scaly stuff' on the surface. The area remains tender, though less so than initially.  No fever or chills. The cyst's location makes it uncomfortable for him to lie down or sit back, but he is still able to work and exercise.  He has been applying warm compresses once daily. He is a engineer, site.    Review of Systems  All other systems reviewed and are negative.       Objective:    BP 98/64   Pulse 67   Ht 5' 11 (1.803 m)   Wt 212 lb (96.2 kg)   SpO2 95%   BMI 29.57 kg/m  BP Readings from Last 3 Encounters:  06/25/24 98/64  06/18/24 111/74  04/15/24 114/72      Physical Exam Vitals and nursing  note reviewed.  Constitutional:      Appearance: Normal appearance.  HENT:     Head: Normocephalic.     Right Ear: External ear normal.     Left Ear: External ear normal.  Eyes:     Conjunctiva/sclera: Conjunctivae normal.  Cardiovascular:     Rate and Rhythm: Normal rate.  Pulmonary:     Effort: Pulmonary effort is normal. No respiratory distress.  Abdominal:     Palpations: Abdomen is soft.  Musculoskeletal:        General: Normal range of motion.  Skin:    General: Skin is warm.  Neurological:     Mental Status: He is alert and oriented to person, place, and time.  Psychiatric:        Mood and Affect: Mood normal.     No results found for any visits on 06/25/24.      Assessment & Plan:   Problem List Items Addressed This Visit   None   Meds ordered this encounter  Medications   doxycycline  (VIBRAMYCIN ) 100 MG capsule    Sig: Take 1 capsule (100 mg total) by mouth 2 (two) times daily for 7 days.    Dispense:  14 capsule    Refill:  0  Assessment and Plan Assessment & Plan Epidermal cyst, left upper back Chronic cyst with recent exacerbation.  Recurrence possible if lining remains. - Apply warm compresses twice daily. - Use Tylenol or Motrin  for pain as needed. - Clean with Hibiclens soap. - Cover with dry gauze if drainage occurs. - Consider I&D if unresolved. - Monitor for infection signs; consider doxycycline  if fever or chills develop.    No follow-ups on file.  Vinary K Kalima Saylor, MD   "

## 2025-04-18 ENCOUNTER — Encounter: Admitting: Family Medicine
